# Patient Record
Sex: Male | Born: 1990 | Race: White | Hispanic: No | Marital: Single | Smoking: Current every day smoker
Health system: Southern US, Community
[De-identification: ages and names within clinical notes are randomized; demographics above are authoritative.]

## PROBLEM LIST (undated history)

## (undated) DIAGNOSIS — G4730 Sleep apnea, unspecified: Secondary | ICD-10-CM

## (undated) DIAGNOSIS — G43909 Migraine, unspecified, not intractable, without status migrainosus: Secondary | ICD-10-CM

## (undated) DIAGNOSIS — J189 Pneumonia, unspecified organism: Secondary | ICD-10-CM

## (undated) DIAGNOSIS — L0591 Pilonidal cyst without abscess: Secondary | ICD-10-CM

## (undated) DIAGNOSIS — Z62810 Personal history of physical and sexual abuse in childhood: Secondary | ICD-10-CM

## (undated) DIAGNOSIS — A4902 Methicillin resistant Staphylococcus aureus infection, unspecified site: Secondary | ICD-10-CM

## (undated) DIAGNOSIS — B019 Varicella without complication: Secondary | ICD-10-CM

## (undated) DIAGNOSIS — F1910 Other psychoactive substance abuse, uncomplicated: Secondary | ICD-10-CM

## (undated) DIAGNOSIS — R519 Headache, unspecified: Secondary | ICD-10-CM

## (undated) DIAGNOSIS — F319 Bipolar disorder, unspecified: Secondary | ICD-10-CM

## (undated) HISTORY — DX: Bipolar disorder, unspecified: F31.9

## (undated) HISTORY — DX: Personal history of physical and sexual abuse in childhood: Z62.810

## (undated) HISTORY — DX: Varicella without complication: B01.9

## (undated) HISTORY — DX: Pilonidal cyst without abscess: L05.91

## (undated) HISTORY — DX: Migraine, unspecified, not intractable, without status migrainosus: G43.909

## (undated) HISTORY — PX: APPENDECTOMY: SHX54

## (undated) HISTORY — DX: Other psychoactive substance abuse, uncomplicated: F19.10

## (undated) HISTORY — DX: Headache, unspecified: R51.9

---
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
## 2008-02-18 ENCOUNTER — Emergency Department: Payer: Self-pay | Admitting: Emergency Medicine

## 2008-07-30 HISTORY — DX: Gilbert syndrome: E80.4

## 2008-09-16 ENCOUNTER — Emergency Department: Payer: Self-pay | Admitting: Emergency Medicine

## 2008-09-19 ENCOUNTER — Emergency Department: Payer: Self-pay | Admitting: Emergency Medicine

## 2009-10-29 ENCOUNTER — Emergency Department: Payer: Self-pay | Admitting: Emergency Medicine

## 2010-06-14 ENCOUNTER — Emergency Department: Payer: Self-pay | Admitting: Emergency Medicine

## 2010-10-12 ENCOUNTER — Emergency Department: Payer: Self-pay

## 2011-02-26 ENCOUNTER — Emergency Department: Payer: Self-pay | Admitting: Emergency Medicine

## 2012-02-18 ENCOUNTER — Emergency Department: Payer: Self-pay | Admitting: Emergency Medicine

## 2012-06-09 ENCOUNTER — Emergency Department: Payer: Self-pay | Admitting: Emergency Medicine

## 2012-06-11 ENCOUNTER — Emergency Department: Payer: Self-pay | Admitting: Emergency Medicine

## 2012-06-16 ENCOUNTER — Emergency Department: Payer: Self-pay | Admitting: Emergency Medicine

## 2012-12-31 ENCOUNTER — Emergency Department: Payer: Self-pay | Admitting: Internal Medicine

## 2013-04-19 ENCOUNTER — Emergency Department: Payer: Self-pay | Admitting: Emergency Medicine

## 2013-08-25 ENCOUNTER — Emergency Department: Payer: Self-pay | Admitting: Emergency Medicine

## 2014-07-30 DIAGNOSIS — F1120 Opioid dependence, uncomplicated: Secondary | ICD-10-CM

## 2014-07-30 HISTORY — DX: Opioid dependence, uncomplicated: F11.20

## 2015-02-09 ENCOUNTER — Emergency Department
Admission: EM | Admit: 2015-02-09 | Discharge: 2015-02-09 | Disposition: A | Discharge status: Home / Self Care | Payer: Self-pay | Attending: Emergency Medicine | Admitting: Emergency Medicine

## 2015-02-09 ENCOUNTER — Encounter: Payer: Self-pay | Admitting: Emergency Medicine

## 2015-02-09 ENCOUNTER — Emergency Department: Payer: Self-pay

## 2015-02-09 DIAGNOSIS — S29012A Strain of muscle and tendon of back wall of thorax, initial encounter: Secondary | ICD-10-CM | POA: Insufficient documentation

## 2015-02-09 DIAGNOSIS — Y9289 Other specified places as the place of occurrence of the external cause: Secondary | ICD-10-CM | POA: Insufficient documentation

## 2015-02-09 DIAGNOSIS — Y9389 Activity, other specified: Secondary | ICD-10-CM | POA: Insufficient documentation

## 2015-02-09 DIAGNOSIS — Z720 Tobacco use: Secondary | ICD-10-CM | POA: Insufficient documentation

## 2015-02-09 DIAGNOSIS — S29019A Strain of muscle and tendon of unspecified wall of thorax, initial encounter: Secondary | ICD-10-CM

## 2015-02-09 DIAGNOSIS — Z880 Allergy status to penicillin: Secondary | ICD-10-CM | POA: Insufficient documentation

## 2015-02-09 DIAGNOSIS — S161XXA Strain of muscle, fascia and tendon at neck level, initial encounter: Secondary | ICD-10-CM | POA: Insufficient documentation

## 2015-02-09 DIAGNOSIS — Y998 Other external cause status: Secondary | ICD-10-CM | POA: Insufficient documentation

## 2015-02-09 MED ORDER — IBUPROFEN 800 MG PO TABS: 800.0000 mg | ORAL_TABLET | Freq: Three times a day (TID) | ORAL | Status: DC | PRN

## 2015-02-09 MED ORDER — CYCLOBENZAPRINE HCL 10 MG PO TABS: 10.0000 mg | ORAL_TABLET | Freq: Three times a day (TID) | ORAL | Status: DC | PRN
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
## 2016-05-22 ENCOUNTER — Emergency Department: Payer: No Typology Code available for payment source

## 2016-05-22 ENCOUNTER — Encounter: Payer: Self-pay | Admitting: Emergency Medicine

## 2016-05-22 ENCOUNTER — Emergency Department
Admission: EM | Admit: 2016-05-22 | Discharge: 2016-05-22 | Disposition: A | Discharge status: Home / Self Care | Payer: No Typology Code available for payment source | Attending: Emergency Medicine | Admitting: Emergency Medicine

## 2016-05-22 DIAGNOSIS — S3992XA Unspecified injury of lower back, initial encounter: Secondary | ICD-10-CM | POA: Diagnosis present

## 2016-05-22 DIAGNOSIS — Y9389 Activity, other specified: Secondary | ICD-10-CM | POA: Diagnosis not present

## 2016-05-22 DIAGNOSIS — Y999 Unspecified external cause status: Secondary | ICD-10-CM | POA: Insufficient documentation

## 2016-05-22 DIAGNOSIS — S39012A Strain of muscle, fascia and tendon of lower back, initial encounter: Secondary | ICD-10-CM | POA: Diagnosis not present

## 2016-05-22 DIAGNOSIS — S161XXA Strain of muscle, fascia and tendon at neck level, initial encounter: Secondary | ICD-10-CM | POA: Diagnosis not present

## 2016-05-22 DIAGNOSIS — Y92410 Unspecified street and highway as the place of occurrence of the external cause: Secondary | ICD-10-CM | POA: Insufficient documentation

## 2016-05-22 DIAGNOSIS — Z791 Long term (current) use of non-steroidal anti-inflammatories (NSAID): Secondary | ICD-10-CM | POA: Insufficient documentation

## 2016-05-22 DIAGNOSIS — F17200 Nicotine dependence, unspecified, uncomplicated: Secondary | ICD-10-CM | POA: Insufficient documentation

## 2016-05-22 MED ORDER — IBUPROFEN 600 MG PO TABS
600.0000 mg | ORAL_TABLET | Freq: Once | ORAL | Status: AC
  Administered 2016-05-22: 600 mg via ORAL
  Filled 2016-05-22: qty 1

## 2016-05-22 MED ORDER — OXYCODONE-ACETAMINOPHEN 5-325 MG PO TABS
1.0000 | ORAL_TABLET | Freq: Once | ORAL | Status: AC
  Administered 2016-05-22: 1 via ORAL
  Filled 2016-05-22: qty 1

## 2016-05-22 MED ORDER — OXYCODONE-ACETAMINOPHEN 7.5-325 MG PO TABS
1.0000 | ORAL_TABLET | Freq: Four times a day (QID) | ORAL | 0 refills | Status: DC | PRN
Start: 2016-05-22 — End: 2019-12-14

## 2016-05-22 MED ORDER — IBUPROFEN 600 MG PO TABS: 600.0000 mg | ORAL_TABLET | Freq: Three times a day (TID) | ORAL | 0 refills | Status: DC | PRN

## 2016-05-22 MED ORDER — METHOCARBAMOL 750 MG PO TABS: 750.0000 mg | ORAL_TABLET | Freq: Four times a day (QID) | ORAL | 0 refills | Status: DC

## 2016-05-22 MED ORDER — CYCLOBENZAPRINE HCL 10 MG PO TABS
10.0000 mg | ORAL_TABLET | Freq: Once | ORAL | Status: AC
  Administered 2016-05-22: 10 mg via ORAL
  Filled 2016-05-22: qty 1
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
## 2016-05-31 ENCOUNTER — Ambulatory Visit: Payer: Self-pay | Admitting: Family Medicine
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
## 2018-04-21 DIAGNOSIS — J180 Bronchopneumonia, unspecified organism: Secondary | ICD-10-CM | POA: Diagnosis not present
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
## 2019-07-06 ENCOUNTER — Other Ambulatory Visit: Payer: Self-pay

## 2019-07-06 DIAGNOSIS — Z20822 Contact with and (suspected) exposure to covid-19: Secondary | ICD-10-CM

## 2019-07-07 LAB — NOVEL CORONAVIRUS, NAA: SARS-CoV-2, NAA: NOT DETECTED

## 2019-09-29 DIAGNOSIS — Z0389 Encounter for observation for other suspected diseases and conditions ruled out: Secondary | ICD-10-CM | POA: Diagnosis not present

## 2019-10-27 DIAGNOSIS — Z79899 Other long term (current) drug therapy: Secondary | ICD-10-CM | POA: Diagnosis not present

## 2019-10-27 DIAGNOSIS — Z0389 Encounter for observation for other suspected diseases and conditions ruled out: Secondary | ICD-10-CM | POA: Diagnosis not present

## 2019-11-23 DIAGNOSIS — F3181 Bipolar II disorder: Secondary | ICD-10-CM | POA: Diagnosis not present

## 2019-11-23 DIAGNOSIS — F1120 Opioid dependence, uncomplicated: Secondary | ICD-10-CM | POA: Diagnosis not present

## 2019-11-24 DIAGNOSIS — F1920 Other psychoactive substance dependence, uncomplicated: Secondary | ICD-10-CM | POA: Diagnosis not present

## 2019-11-24 DIAGNOSIS — Z79899 Other long term (current) drug therapy: Secondary | ICD-10-CM | POA: Diagnosis not present

## 2019-11-24 DIAGNOSIS — Z0389 Encounter for observation for other suspected diseases and conditions ruled out: Secondary | ICD-10-CM | POA: Diagnosis not present

## 2019-12-10 ENCOUNTER — Other Ambulatory Visit: Payer: Self-pay

## 2019-12-14 ENCOUNTER — Other Ambulatory Visit: Payer: Self-pay

## 2019-12-14 ENCOUNTER — Encounter: Payer: Self-pay | Admitting: Nurse Practitioner

## 2019-12-14 ENCOUNTER — Ambulatory Visit: Payer: BC Managed Care – PPO | Admitting: Nurse Practitioner

## 2019-12-14 VITALS — BP 120/82 | HR 108 | Temp 98.1°F | Ht 71.0 in | Wt 282.0 lb

## 2019-12-14 DIAGNOSIS — Z6839 Body mass index (BMI) 39.0-39.9, adult: Secondary | ICD-10-CM | POA: Diagnosis not present

## 2019-12-14 DIAGNOSIS — F319 Bipolar disorder, unspecified: Secondary | ICD-10-CM

## 2019-12-14 DIAGNOSIS — G4730 Sleep apnea, unspecified: Secondary | ICD-10-CM | POA: Diagnosis not present

## 2019-12-14 DIAGNOSIS — G43009 Migraine without aura, not intractable, without status migrainosus: Secondary | ICD-10-CM | POA: Diagnosis not present

## 2019-12-14 DIAGNOSIS — L988 Other specified disorders of the skin and subcutaneous tissue: Secondary | ICD-10-CM

## 2019-12-14 DIAGNOSIS — R030 Elevated blood-pressure reading, without diagnosis of hypertension: Secondary | ICD-10-CM

## 2019-12-14 DIAGNOSIS — F4321 Adjustment disorder with depressed mood: Secondary | ICD-10-CM

## 2019-12-14 DIAGNOSIS — E669 Obesity, unspecified: Secondary | ICD-10-CM | POA: Diagnosis not present

## 2019-12-14 DIAGNOSIS — Z0000 Encounter for general adult medical examination without abnormal findings: Secondary | ICD-10-CM
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
## 2019-12-14 NOTE — Patient Instructions (Addendum)
It was nice to meet you today.  I have sent you to the laboratory today to have routine preventative healthcare blood work including your thyroid checked.  We will look for risk for diabetes, high cholesterol, check your kidney function, blood counts.  We will check a urinalysis.  I have placed an URGENT  referral in for sleep apnea study.  Your fiance states that she stopped breathing, you snore, you have excessive fatigue, and you wake up at night feeling short of breath.  You should hear from the ER pulmonologist in the next week.  I have placed an urgent referral.  I also placed a routine referral for your history of migraine headaches.    It is important to have your sleep corrected is not one of the palpitations and needs to be in place to prevent migraines.  Also healthy diet, exercise, stress management are all very important.  I am sorry to hear about the recent loss of your father.  You have been experiencing grief, stress, some anxiety and depression.  I would recommend behavioral therapist help with grief.  You may benefit from contacting the hospice home where they are going to therapy.  [facility] [place] grief counseling [phone].   Please see me in one month to see how you are doing.

## 2019-12-15 ENCOUNTER — Encounter: Payer: Self-pay | Admitting: Nurse Practitioner

## 2019-12-15 DIAGNOSIS — F319 Bipolar disorder, unspecified: Secondary | ICD-10-CM | POA: Insufficient documentation

## 2019-12-15 DIAGNOSIS — Z0000 Encounter for general adult medical examination without abnormal findings: Secondary | ICD-10-CM | POA: Insufficient documentation

## 2019-12-15 DIAGNOSIS — L988 Other specified disorders of the skin and subcutaneous tissue: Secondary | ICD-10-CM

## 2019-12-15 DIAGNOSIS — G43009 Migraine without aura, not intractable, without status migrainosus: Secondary | ICD-10-CM | POA: Insufficient documentation

## 2019-12-15 DIAGNOSIS — F4321 Adjustment disorder with depressed mood: Secondary | ICD-10-CM | POA: Insufficient documentation

## 2019-12-15 DIAGNOSIS — R030 Elevated blood-pressure reading, without diagnosis of hypertension: Secondary | ICD-10-CM | POA: Insufficient documentation

## 2019-12-15 DIAGNOSIS — G4730 Sleep apnea, unspecified: Secondary | ICD-10-CM | POA: Insufficient documentation

## 2019-12-15 DIAGNOSIS — Z6839 Body mass index (BMI) 39.0-39.9, adult: Secondary | ICD-10-CM | POA: Insufficient documentation

## 2019-12-15 HISTORY — DX: Other specified disorders of the skin and subcutaneous tissue: L98.8

## 2019-12-15 LAB — COMPREHENSIVE METABOLIC PANEL
ALT: 23 U/L (ref 0–53)
AST: 20 U/L (ref 0–37)
Albumin: 4.5 g/dL (ref 3.5–5.2)
Alkaline Phosphatase: 45 U/L (ref 39–117)
BUN: 14 mg/dL (ref 6–23)
CO2: 30 mEq/L (ref 19–32)
Calcium: 9.2 mg/dL (ref 8.4–10.5)
Chloride: 103 mEq/L (ref 96–112)
Creatinine, Ser: 1.04 mg/dL (ref 0.40–1.50)
GFR: 83.4 mL/min (ref >60.00)
Glucose, Bld: 87 mg/dL (ref 70–99)
Potassium: 4.4 mEq/L (ref 3.5–5.1)
Sodium: 137 mEq/L (ref 135–145)
Total Bilirubin: 0.7 mg/dL (ref 0.2–1.2)
Total Protein: 6.8 g/dL (ref 6.0–8.3)

## 2019-12-15 LAB — CBC WITH DIFFERENTIAL/PLATELET
Basophils Absolute: 0 10*3/uL (ref 0.0–0.1)
Basophils Relative: 0.5 % (ref 0.0–3.0)
Eosinophils Absolute: 0.2 10*3/uL (ref 0.0–0.7)
Eosinophils Relative: 3.4 % (ref 0.0–5.0)
HCT: 43.1 % (ref 39.0–52.0)
Hemoglobin: 14.8 g/dL (ref 13.0–17.0)
Lymphocytes Relative: 25.4 % (ref 12.0–46.0)
Lymphs Abs: 1.7 10*3/uL (ref 0.7–4.0)
MCHC: 34.5 g/dL (ref 30.0–36.0)
MCV: 94.3 fl (ref 78.0–100.0)
Monocytes Absolute: 0.3 10*3/uL (ref 0.1–1.0)
Monocytes Relative: 4.8 % (ref 3.0–12.0)
Neutro Abs: 4.5 10*3/uL (ref 1.4–7.7)
Neutrophils Relative %: 65.9 % (ref 43.0–77.0)
Platelets: 333 10*3/uL (ref 150.0–400.0)
RBC: 4.56 Mil/uL (ref 4.22–5.81)
RDW: 12.6 % (ref 11.5–15.5)
WBC: 6.9 10*3/uL (ref 4.0–10.5)

## 2019-12-15 LAB — URINALYSIS, ROUTINE W REFLEX MICROSCOPIC
Bilirubin Urine: NEGATIVE
Hgb urine dipstick: NEGATIVE
Ketones, ur: NEGATIVE
Leukocytes,Ua: NEGATIVE
Nitrite: NEGATIVE
RBC / HPF: NONE SEEN (ref 0–?)
Specific Gravity, Urine: 1.02 (ref 1.000–1.030)
Total Protein, Urine: NEGATIVE
Urine Glucose: NEGATIVE
Urobilinogen, UA: 0.2 (ref 0.0–1.0)
pH: 7 (ref 5.0–8.0)

## 2019-12-15 LAB — LIPID PANEL
Cholesterol: 184 mg/dL (ref 0–200)
HDL: 44.8 mg/dL (ref >39.00)
LDL Cholesterol: 116 mg/dL — ABNORMAL HIGH (ref 0–99)
NonHDL: 138.76
Total CHOL/HDL Ratio: 4
Triglycerides: 115 mg/dL (ref 0.0–149.0)
VLDL: 23 mg/dL (ref 0.0–40.0)

## 2019-12-15 LAB — HEMOGLOBIN A1C: Hgb A1c MFr Bld: 5.2 % (ref 4.6–6.5)

## 2019-12-15 NOTE — Assessment & Plan Note (Signed)
Referral to PULM for sleep apnea testing.

## 2019-12-15 NOTE — Assessment & Plan Note (Signed)
Referral to Neurology. Taking NSAIDs as needed.

## 2019-12-15 NOTE — Assessment & Plan Note (Signed)
To monitor BP
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
## 2019-12-15 NOTE — Assessment & Plan Note (Signed)
He reports having a hard time after his father passed. He has now been experiencing grief after the loss of his father in January. He thinks he would benefit from additional behavioral therapy counseling as this service is not offered at his [place] office. GAD-&: 16, PHQ-9: 18 No SI or HI: " I would never do that." PLAN: Given grief counseling at Hospice. Talk with [facility] psychiatrist.

## 2019-12-15 NOTE — Assessment & Plan Note (Signed)
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 Previous surgical consult. He  is not interested in surgical referral at this time. He does not have work time off for healing and does not want any pain meds as he is recovering from opioid addiction on suboxone.

## 2019-12-15 NOTE — Assessment & Plan Note (Signed)
Healthy diet/lifestyle

## 2019-12-16 ENCOUNTER — Other Ambulatory Visit: Payer: BC Managed Care – PPO

## 2019-12-16 ENCOUNTER — Telehealth: Payer: Self-pay | Admitting: Nurse Practitioner

## 2019-12-16 DIAGNOSIS — F319 Bipolar disorder, unspecified: Secondary | ICD-10-CM

## 2019-12-16 LAB — VITAMIN D 25 HYDROXY (VIT D DEFICIENCY, FRACTURES): VITD: 23 ng/mL — ABNORMAL LOW (ref 30.00–100.00)

## 2019-12-16 LAB — TSH: TSH: 0.87 u[IU]/mL (ref 0.35–4.50)

## 2019-12-16 NOTE — Telephone Encounter (Signed)
Faxed add on sheet to lab

## 2019-12-16 NOTE — Telephone Encounter (Signed)
Please add-on Vit D level.

## 2019-12-22 DIAGNOSIS — Z0389 Encounter for observation for other suspected diseases and conditions ruled out: Secondary | ICD-10-CM | POA: Diagnosis not present

## 2019-12-22 DIAGNOSIS — Z5181 Encounter for therapeutic drug level monitoring: Secondary | ICD-10-CM | POA: Diagnosis not present

## 2020-01-12 ENCOUNTER — Other Ambulatory Visit: Payer: Self-pay

## 2020-01-14 ENCOUNTER — Other Ambulatory Visit: Payer: Self-pay

## 2020-01-14 ENCOUNTER — Ambulatory Visit: Payer: BC Managed Care – PPO | Admitting: Nurse Practitioner

## 2020-01-14 ENCOUNTER — Encounter: Payer: Self-pay | Admitting: Nurse Practitioner

## 2020-01-14 VITALS — BP 128/82 | HR 96 | Temp 96.9°F | Ht 71.0 in | Wt 281.0 lb

## 2020-01-14 DIAGNOSIS — F4321 Adjustment disorder with depressed mood: Secondary | ICD-10-CM | POA: Diagnosis not present

## 2020-01-14 DIAGNOSIS — I10 Essential (primary) hypertension: Secondary | ICD-10-CM | POA: Insufficient documentation

## 2020-01-14 DIAGNOSIS — R000 Tachycardia, unspecified: Secondary | ICD-10-CM | POA: Insufficient documentation

## 2020-01-14 DIAGNOSIS — F1121 Opioid dependence, in remission: Secondary | ICD-10-CM

## 2020-01-14 DIAGNOSIS — R030 Elevated blood-pressure reading, without diagnosis of hypertension: Secondary | ICD-10-CM | POA: Diagnosis not present

## 2020-01-14 DIAGNOSIS — G43009 Migraine without aura, not intractable, without status migrainosus: Secondary | ICD-10-CM

## 2020-01-14 NOTE — Patient Instructions (Addendum)
You are making some wonderful lifestyle changes.  I congratulate you for working on smoking.  Purchase a blood pressure cuff OMRON is a good brand- check your blood pressure and bring  in the readings  in 4 months.  We can see how you are getting along.  If your blood pressure is elevated we can start treatment. But,  hopefully your lifestyle changes will make a huge difference.  Continue with a low-fat, low transfat, heart healthy diet.  Continue with regular exercise.  Continue with your smoking cessation plans.  Once you get evaluated for sleep apnea and if needed treated, you might see good improvement in your blood pressure as well.  Continue with the vitamin D supplement and we can check that with your next labs.
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
## 2020-01-16 DIAGNOSIS — F1121 Opioid dependence, in remission: Secondary | ICD-10-CM | POA: Insufficient documentation

## 2020-01-16 NOTE — Assessment & Plan Note (Addendum)
Remains on Zubsolv 5.7-1.4 mg and taking 2 tablets under the tongue daily. Followed by Dr. Caleen Jobs with [facility] in [place]. She prescribes Suboxone (zubsolv ) 5.7-1.4 mg SUBL place 2 tablets under tongue daily for opioid addiction and does monthly video visits and urine drug testing monthly. He attends local AA meetings because he finds the likes that group better and  follows their 12-step program.  He never had alcohol addiction, just the opioids.

## 2020-01-16 NOTE — Assessment & Plan Note (Signed)
No problem with migraine HA recently.
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
## 2020-01-16 NOTE — Assessment & Plan Note (Signed)
Reports he has been feeling much better.  He has been addressing his grief with a Veterinary surgeon.  He had a vacation.

## 2020-01-18 DIAGNOSIS — F1120 Opioid dependence, uncomplicated: Secondary | ICD-10-CM | POA: Diagnosis not present

## 2020-01-18 DIAGNOSIS — F3181 Bipolar II disorder: Secondary | ICD-10-CM | POA: Diagnosis not present

## 2020-01-19 ENCOUNTER — Other Ambulatory Visit: Payer: Self-pay

## 2020-01-19 ENCOUNTER — Ambulatory Visit: Payer: BC Managed Care – PPO | Admitting: Pulmonary Disease

## 2020-01-19 ENCOUNTER — Encounter: Payer: Self-pay | Admitting: Pulmonary Disease

## 2020-01-19 VITALS — BP 140/90 | HR 104 | Ht 71.0 in | Wt 283.0 lb

## 2020-01-19 DIAGNOSIS — Z9189 Other specified personal risk factors, not elsewhere classified: Secondary | ICD-10-CM | POA: Insufficient documentation

## 2020-01-19 DIAGNOSIS — F17200 Nicotine dependence, unspecified, uncomplicated: Secondary | ICD-10-CM

## 2020-01-19 MED ORDER — NICOTINE POLACRILEX 4 MG MT LOZG: 4.0000 mg | LOZENGE | OROMUCOSAL | 3 refills | Status: DC | PRN

## 2020-01-19 NOTE — Assessment & Plan Note (Signed)
Epworth score today 12 High likelihood of obstructive sleep apnea given patient's reporting of poor sleep  Plan: Home sleep study ordered today

## 2020-01-19 NOTE — Assessment & Plan Note (Signed)
Plan: Emphasized need to stop smoking Continue to work with smoking cessation team from employer Start 21 mg nicotine patch as managed by smoking cessation team Prescription for 4 mg nicotine lozenge today Please contact our office if you are having difficulty with stopping smoking Recommended Pneumovax 23, you declined this today
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
## 2020-01-19 NOTE — Patient Instructions (Addendum)
You were seen today by [name], NP  for:   1. At risk for obstructive sleep apnea  - Home sleep test; Future  2. Smoker  - nicotine polacrilex (COMMIT) 4 MG lozenge; Take 1 lozenge (4 mg total) by mouth as needed for smoking cessation.  Dispense: 108 tablet; Refill: 3  We recommend that you stop smoking.  >>>You need to set a quit date >>>If you have friends or family who smoke, let them know you are trying to quit and not to smoke around you or in your living environment  Smoking Cessation Resources:  1 800 QUIT NOW  >>> Patient to call this resource and utilize it to help support her quit smoking >>> Keep up your hard work with stopping smoking  You can also contact the [facility] >>>For smoking cessation classes call [phone]  We do not recommend using e-cigarettes as a form of stopping smoking  You can sign up for smoking cessation support texts and information:  >>>https://smokefree.gov/smokefreetxt   Nicotine patches: >>>Make sure you rotate sites that you do not get skin irritation, Apply 1 patch each morning to a non-hairy skin site  If you are smoking greater than 10 cigarettes/day and weigh over 45 kg start with the nicotine patch of 21 mg a day for 6 weeks, then 14 mg a day for 2 weeks, then finished with 7 mg a day for 2 weeks, then stop  If you are smoking less than 10 cigarettes a day or weight less than 45 kg start with medium dose pack of 14 mg a day for 6 weeks, followed by 7 mg a day for 2 weeks   >>>If insomnia occurs you are having trouble sleeping you can take the patch off at night, and place a new one on in the morning >>>If the patch is removed at night and you have morning cravings start short acting nicotine replacement therapy such as gum or lozenges   Nicotine lozenge: Lozenges are commonly uses short acting NRT product  >>>Smokers who smoke within 30 minutes of awakening should use 4 mg dose >>>Smokers who wait more than  30 minutes after awakening to smoke should use 2 mg dose  Can use up to 1 lozenge every 1-2 hours for 6 weeks >>>Total amount of lozenges that can be used per day as 20 >>>Gradually reduce number of lozenges used per day after 2 weeks of use  Place lozenge in mouth and allowed to dissolve for 30 minutes loss and does not need to be chewed  Lozenges have advantages to be able to be used in people with TMG, poor dentition, dentures   We recommended the pneumonia vaccine Pneumovax 23, you declined   We recommend today:  Orders Placed This Encounter  Procedures  . Home sleep test    Standing Status:   Future    Standing Expiration Date:   01/18/2021    Order Specific Question:   Where should this test be performed:    Answer:   [facility] - Pul[facility]   Orders Placed This Encounter  Procedures  . Home sleep test   Meds ordered this encounter  Medications  . nicotine polacrilex (COMMIT) 4 MG lozenge    Sig: Take 1 lozenge (4 mg total) by mouth as needed for smoking cessation.    Dispense:  108 tablet    Refill:  3    Follow Up:    Return in about 3 months (around 04/20/2020), or if symptoms  worsen or fail to improve, for [name].   Please do your part to reduce the spread of COVID-19:      Reduce your risk of any infection  and COVID19 by using the similar precautions used for avoiding the common cold or flu:  [name] Wash your hands often with soap and warm water for at least 20 seconds.  If soap and water are not readily available, use an alcohol-based hand sanitizer with at least 60% alcohol.  . If coughing or sneezing, cover your mouth and nose by coughing or sneezing into the elbow areas of your shirt or coat, into a tissue or into your sleeve (not your hands). [name] A MASK when in public  . Avoid shaking hands with others and consider head nods or verbal greetings only. . Avoid touching your eyes, nose, or mouth with unwashed hands.  . Avoid close contact  with people who are sick. . Avoid places or events with large numbers of people in one location, like concerts or sporting events. . If you have some symptoms but not all symptoms, continue to monitor at home and seek medical attention if your symptoms worsen. . If you are having a medical emergency, call 911.   ADDITIONAL HEALTHCARE OPTIONS FOR PATIENTS  [facility] / e-Visit: [email or web address]         [facility]: [phone]  [facility]: [phone]                   [facility]: [phone]     It is flu season:   >>> Best ways to protect herself from the flu: Receive the yearly flu vaccine, practice good hand hygiene washing with soap and also using hand sanitizer when available, eat a nutritious meals, get adequate rest, hydrate appropriately   Please contact the office if your symptoms worsen or you have concerns that you are not improving.   Thank you for choosing [facility] for your healthcare, and for allowing [place] to partner with you on your healthcare journey. I am thankful to be able to provide care to you today.   [name] FNP-C   Sleep Apnea Sleep apnea affects breathing during sleep. It causes breathing to stop for a short time or to become shallow. It can also increase the risk of:  Heart attack.  Stroke.  Being very overweight (obese).  Diabetes.  Heart failure.  Irregular heartbeat. The goal of treatment is to help you breathe normally again. What are the causes? There are three kinds of sleep apnea:  Obstructive sleep apnea. This is caused by a blocked or collapsed airway.  Central sleep apnea. This happens when the brain does not send the right signals to the muscles that control breathing.  Mixed sleep apnea. This is a combination of obstructive and central sleep apnea. The most common cause of this condition is a collapsed or blocked  airway. This can happen if:  Your throat muscles are too relaxed.  Your tongue and tonsils are too large.  You are overweight.  Your airway is too small. What increases the risk?  Being overweight.  Smoking.  Having a small airway.  Being older.  Being male.  Drinking alcohol.  Taking medicines to calm yourself (sedatives or tranquilizers).  Having family members with the condition. What are the signs or symptoms?  Trouble staying asleep.  Being sleepy or tired during the day.  Getting angry a lot.  Loud snoring.  Headaches in the morning.  Not being able to focus your mind (concentrate).  Forgetting things.  Less interest in sex.  Mood swings.  Personality changes.  Feelings of sadness (depression).  Waking up a lot during the night to pee (urinate).  Dry mouth.  Sore throat. How is this diagnosed?  Your medical history.  A physical exam.  A test that is done when you are sleeping (sleep study). The test is most often done in a sleep lab but may also be done at home. How is this treated?   Sleeping on your side.  Using a medicine to get rid of mucus in your nose (decongestant).  Avoiding the use of alcohol, medicines to help you relax, or certain pain medicines (narcotics).  Losing weight, if needed.  Changing your diet.  Not smoking.  Using a machine to open your airway while you sleep, such as: ? An oral appliance. This is a mouthpiece that shifts your lower jaw forward. ? A CPAP device. This device blows air through a mask when you breathe out (exhale). ? An EPAP device. This has valves that you put in each nostril. ? A BPAP device. This device blows air through a mask when you breathe in (inhale) and breathe out.  Having surgery if other treatments do not work. It is important to get treatment for sleep apnea. Without treatment, it can lead to:  High blood pressure.  Coronary artery disease.  In men, not being able to have  an erection (impotence).  Reduced thinking ability. Follow these instructions at home: Lifestyle  Make changes that your doctor recommends.  Eat a healthy diet.  Lose weight if needed.  Avoid alcohol, medicines to help you relax, and some pain medicines.  Do not use any products that contain nicotine or tobacco, such as cigarettes, e-cigarettes, and chewing tobacco. If you need help quitting, ask your doctor. General instructions  Take over-the-counter and prescription medicines only as told by your doctor.  If you were given a machine to use while you sleep, use it only as told by your doctor.  If you are having surgery, make sure to tell your doctor you have sleep apnea. You may need to bring your device with you.  Keep all follow-up visits as told by your doctor. This is important. Contact a doctor if:  The machine that you were given to use during sleep bothers you or does not seem to be working.  You do not get better.  You get worse. Get help right away if:  Your chest hurts.  You have trouble breathing in enough air.  You have an uncomfortable feeling in your back, arms, or stomach.  You have trouble talking.  One side of your body feels weak.  A part of your face is hanging down. These symptoms may be an emergency. Do not wait to see if the symptoms will go away. Get medical help right away. Call your local emergency services (911 in the [place] Do not drive yourself to the hospital. Summary  This condition affects breathing during sleep.  The most common cause is a collapsed or blocked airway.  The goal of treatment is to help you breathe normally while you sleep. This information is not intended to replace advice given to you by your health care provider. Make sure you discuss any questions you have with your health care provider. Document Revised: 05/02/2018 Document Reviewed: 03/11/2018 Elsevier Patient Education  2020 [employer].  Health  Risks of Smoking Smoking cigarettes is very bad for your health. Tobacco smoke has over 200 known poisons in it. It contains the poisonous gases nitrogen oxide and carbon monoxide. There are over 60 chemicals in tobacco smoke that cause cancer. Smoking is difficult to quit because a chemical in tobacco, called nicotine, causes addiction or dependence. When you smoke and inhale, nicotine is absorbed rapidly into the bloodstream through your lungs. Both inhaled and non-inhaled nicotine may be addictive. What are the risks of cigarette smoke? Cigarette smokers have an increased risk of many serious medical problems, including:  Lung cancer.  Lung disease, such as pneumonia, bronchitis, and emphysema.  Chest pain (angina) and heart attack because the heart is not getting enough oxygen.  Heart disease and peripheral blood vessel disease.  High blood pressure (hypertension).  Stroke.  Oral cancer, including cancer of the lip, mouth, or voice box.  Bladder cancer.  Pancreatic cancer.  Cervical cancer.  Pregnancy complications, including premature birth.  Stillbirths and smaller newborn babies, birth defects, and genetic damage to sperm.  Early menopause.  Lower estrogen level for women.  Infertility.  Facial wrinkles.  Blindness.  Increased risk of broken bones (fractures).  Senile dementia.  Stomach ulcers and internal bleeding.  Delayed wound healing and increased risk of complications during surgery.  Even smoking lightly shortens your life expectancy by several years. Because of secondhand smoke exposure, children of smokers have an increased risk of the following:  Sudden infant death syndrome (SIDS).  Respiratory infections.  Lung cancer.  Heart disease.  Ear infections. What are the benefits of quitting? There are many health benefits of quitting smoking. Here are some of them:  Within days of quitting smoking, your risk of having a heart attack  decreases, your blood flow improves, and your lung capacity improves. Blood pressure, pulse rate, and breathing patterns start returning to normal soon after quitting.  Within months, your lungs may clear up completely.  Quitting for 10 years reduces your risk of developing lung cancer and heart disease to almost that of a nonsmoker.  People who quit may see an improvement in their overall quality of life. How do I quit smoking?     Smoking is an addiction with both physical and psychological effects, and longtime habits can be hard to change. Your health care provider can recommend:  Programs and community resources, which may include group support, education, or talk therapy.  Prescription medicines to help reduce cravings.  Nicotine replacement products, such as patches, gum, and nasal sprays. Use these products only as directed. Do not replace cigarette smoking with electronic cigarettes, which are commonly called e-cigarettes. The safety of e-cigarettes is not known, and some may contain harmful chemicals.  A combination of two or more of these methods. Where to find more information  American Lung Association: www.lung.org  American Cancer Society: www.cancer.org Summary  Smoking cigarettes is very bad for your health. Cigarette smokers have an increased risk of many serious medical problems, including several cancers, heart disease, and stroke.  Smoking is an addiction with both physical and psychological effects, and longtime habits can be hard to change.  By stopping right away, you can greatly reduce the risk of medical problems for you and your family.  To help you quit smoking, your health care provider can recommend programs, community resources, prescription medicines, and nicotine replacement products such as patches, gum, and nasal sprays. This information is not intended to replace advice given to you by your  health care provider. Make sure you discuss any questions  you have with your health care provider. Document Revised: 10/17/2017 Document Reviewed: 07/20/2016 Elsevier Patient Education  2020 [employer]

## 2020-01-20 DIAGNOSIS — Z0389 Encounter for observation for other suspected diseases and conditions ruled out: Secondary | ICD-10-CM | POA: Diagnosis not present

## 2020-01-20 DIAGNOSIS — Z5181 Encounter for therapeutic drug level monitoring: Secondary | ICD-10-CM | POA: Diagnosis not present

## 2020-01-25 NOTE — Progress Notes (Signed)
Reviewed and agree with assessment/plan.   Burlin Mcnair, MD [facility] 01/25/2020, 7:07 AM Pager:  [phone]  

## 2020-02-08 ENCOUNTER — Telehealth: Payer: Self-pay | Admitting: Nurse Practitioner

## 2020-02-08 ENCOUNTER — Ambulatory Visit
Admission: EM | Admit: 2020-02-08 | Discharge: 2020-02-08 | Disposition: A | Discharge status: Home / Self Care | Payer: BC Managed Care – PPO

## 2020-02-08 DIAGNOSIS — R109 Unspecified abdominal pain: Secondary | ICD-10-CM

## 2020-02-08 NOTE — ED Triage Notes (Signed)
Patient reports a sharp stabbing pain to the right of the umbilicus x2 days. Reports that it only happens when he bends over to pick something up. States that it is so intense, it makes him sweat.   Denies: bloating, diarrhea, vomiting  OTC: ibuprofen
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
## 2020-02-08 NOTE — Telephone Encounter (Signed)
Pt went to [facility] because we had no available appointments. They told him he needed a referral for a CT Scan to rule out a hernia.

## 2020-02-08 NOTE — Discharge Instructions (Signed)
Contact your primary care office to request a visit either virtually or in person to be evaluated for an outpatient CT scan given the symptoms she presented with today.  Given the limited setting of urgent care unable to provide advanced imaging to determine the exact cause of your symptoms however they are concerning for a possible hernia.  I recommend continuing the ibuprofen, and applying warm compresses to the area in which you are experiencing this pain.  As we discussed if symptoms become severe to go immediately to the emergency department however at present I feel following up with your primary care provider is definitely appropriate for further work-up of your current symptoms.

## 2020-02-08 NOTE — Telephone Encounter (Signed)
I will need to see him in the office. I do not see the ED work up  in [employer], yet. The ED could order the CT scan.  Can you triage his complaint?

## 2020-02-09 NOTE — Telephone Encounter (Signed)
UC note is available in chart now. Please advise.

## 2020-02-09 NOTE — Telephone Encounter (Signed)
Appointment has been scheduled for tomorrow at 0800.

## 2020-02-10 ENCOUNTER — Ambulatory Visit
Admission: RE | Admit: 2020-02-10 | Discharge: 2020-02-10 | Disposition: A | Discharge status: Home / Self Care | Payer: BC Managed Care – PPO | Source: Ambulatory Visit | Attending: Nurse Practitioner | Admitting: Nurse Practitioner

## 2020-02-10 ENCOUNTER — Other Ambulatory Visit: Payer: Self-pay

## 2020-02-10 ENCOUNTER — Ambulatory Visit: Payer: BC Managed Care – PPO | Admitting: Nurse Practitioner

## 2020-02-10 VITALS — BP 128/78 | HR 81 | Temp 97.6°F | Resp 16 | Ht 71.0 in | Wt 283.0 lb

## 2020-02-10 DIAGNOSIS — K5730 Diverticulosis of large intestine without perforation or abscess without bleeding: Secondary | ICD-10-CM | POA: Diagnosis not present

## 2020-02-10 DIAGNOSIS — R1031 Right lower quadrant pain: Secondary | ICD-10-CM | POA: Diagnosis not present

## 2020-02-10 DIAGNOSIS — K5901 Slow transit constipation: Secondary | ICD-10-CM

## 2020-02-10 DIAGNOSIS — L988 Other specified disorders of the skin and subcutaneous tissue: Secondary | ICD-10-CM

## 2020-02-10 DIAGNOSIS — K760 Fatty (change of) liver, not elsewhere classified: Secondary | ICD-10-CM | POA: Diagnosis not present

## 2020-02-10 DIAGNOSIS — Q8909 Congenital malformations of spleen: Secondary | ICD-10-CM | POA: Diagnosis not present

## 2020-02-10 DIAGNOSIS — D7389 Other diseases of spleen: Secondary | ICD-10-CM | POA: Diagnosis not present

## 2020-02-10 LAB — URINALYSIS, ROUTINE W REFLEX MICROSCOPIC
Bilirubin Urine: NEGATIVE
Ketones, ur: NEGATIVE
Leukocytes,Ua: NEGATIVE
Nitrite: NEGATIVE
Specific Gravity, Urine: 1.025 (ref 1.000–1.030)
Total Protein, Urine: NEGATIVE
Urine Glucose: NEGATIVE
Urobilinogen, UA: 0.2 (ref 0.0–1.0)
pH: 6.5 (ref 5.0–8.0)

## 2020-02-10 LAB — BASIC METABOLIC PANEL
BUN: 12 mg/dL (ref 6–23)
CO2: 25 mEq/L (ref 19–32)
Calcium: 9 mg/dL (ref 8.4–10.5)
Chloride: 105 mEq/L (ref 96–112)
Creatinine, Ser: 1.04 mg/dL (ref 0.40–1.50)
GFR: 83.31 mL/min (ref >60.00)
Glucose, Bld: 97 mg/dL (ref 70–99)
Potassium: 3.8 mEq/L (ref 3.5–5.1)
Sodium: 138 mEq/L (ref 135–145)

## 2020-02-10 LAB — CBC WITH DIFFERENTIAL/PLATELET
Basophils Absolute: 0.1 10*3/uL (ref 0.0–0.1)
Basophils Relative: 1 % (ref 0.0–3.0)
Eosinophils Absolute: 0.3 10*3/uL (ref 0.0–0.7)
Eosinophils Relative: 5.7 % — ABNORMAL HIGH (ref 0.0–5.0)
HCT: 43.4 % (ref 39.0–52.0)
Hemoglobin: 15 g/dL (ref 13.0–17.0)
Lymphocytes Relative: 32.2 % (ref 12.0–46.0)
Lymphs Abs: 1.9 10*3/uL (ref 0.7–4.0)
MCHC: 34.6 g/dL (ref 30.0–36.0)
MCV: 93.7 fl (ref 78.0–100.0)
Monocytes Absolute: 0.4 10*3/uL (ref 0.1–1.0)
Monocytes Relative: 7.4 % (ref 3.0–12.0)
Neutro Abs: 3.2 10*3/uL (ref 1.4–7.7)
Neutrophils Relative %: 53.7 % (ref 43.0–77.0)
Platelets: 292 10*3/uL (ref 150.0–400.0)
RBC: 4.63 Mil/uL (ref 4.22–5.81)
RDW: 12.7 % (ref 11.5–15.5)
WBC: 6 10*3/uL (ref 4.0–10.5)

## 2020-02-10 MED ORDER — IOHEXOL 300 MG/ML  SOLN
100.0000 mL | Freq: Once | INTRAMUSCULAR | Status: AC | PRN
  Administered 2020-02-10: 100 mL via INTRAVENOUS

## 2020-02-10 NOTE — Patient Instructions (Addendum)
For your right sided abdominal pain- I have ordered labs today and a CT abdomen/pelvis- you will be called about when to get that done.  further recommendations pending the results.     Abdominal Pain, Adult Pain in the abdomen (abdominal pain) can be caused by many things. Often, abdominal pain is not serious and it gets better with no treatment or by being treated at home. However, sometimes abdominal pain is serious. Your health care provider will ask questions about your medical history and do a physical exam to try to determine the cause of your abdominal pain. Follow these instructions at home:  Medicines  Take over-the-counter and prescription medicines only as told by your health care provider.  Do not take a laxative unless told by your health care provider. General instructions  Watch your condition for any changes.  Drink enough fluid to keep your urine pale yellow.  Keep all follow-up visits as told by your health care provider. This is important. Contact a health care provider if:  Your abdominal pain changes or gets worse.  You are not hungry or you lose weight without trying.  You are constipated or have diarrhea for more than 2-3 days.  You have pain when you urinate or have a bowel movement.  Your abdominal pain wakes you up at night.  Your pain gets worse with meals, after eating, or with certain foods.  You are vomiting and cannot keep anything down.  You have a fever.  You have blood in your urine. Get help right away if:  Your pain does not go away as soon as your health care provider told you to expect.  You cannot stop vomiting.  Your pain is only in areas of the abdomen, such as the right side or the left lower portion of the abdomen. Pain on the right side could be caused by appendicitis.  You have bloody or black stools, or stools that look like tar.  You have severe pain, cramping, or bloating in your abdomen.  You have signs of  dehydration, such as: ? Dark urine, very little urine, or no urine. ? Cracked lips. ? Dry mouth. ? Sunken eyes. ? Sleepiness. ? Weakness.  You have trouble breathing or chest pain. Summary  Often, abdominal pain is not serious and it gets better with no treatment or by being treated at home. However, sometimes abdominal pain is serious.  Watch your condition for any changes.  Take over-the-counter and prescription medicines only as told by your health care provider.  Contact a health care provider if your abdominal pain changes or gets worse.  Get help right away if you have severe pain, cramping, or bloating in your abdomen. This information is not intended to replace advice given to you by your health care provider. Make sure you discuss any questions you have with your health care provider. Document Revised: 11/24/2018 Document Reviewed: 11/24/2018 Elsevier Patient Education  2020 [employer].  Constipation, Adult Constipation is when a person has fewer bowel movements in a week than normal, has difficulty having a bowel movement, or has stools that are dry, hard, or larger than normal. Constipation may be caused by an underlying condition. It may become worse with age if a person takes certain medicines and does not take in enough fluids. Follow these instructions at home: Eating and drinking   Eat foods that have a lot of fiber, such as fresh fruits and vegetables, whole grains, and beans.  Limit foods that are high  in fat, low in fiber, or overly processed, such as french fries, hamburgers, cookies, candies, and soda.  Drink enough fluid to keep your urine clear or pale yellow. General instructions  Exercise regularly or as told by your health care provider.  Go to the restroom when you have the urge to go. Do not hold it in.  Take over-the-counter and prescription medicines only as told by your health care provider. These include any fiber supplements.  Practice  pelvic floor retraining exercises, such as deep breathing while relaxing the lower abdomen and pelvic floor relaxation during bowel movements.  Watch your condition for any changes.  Keep all follow-up visits as told by your health care provider. This is important. Contact a health care provider if:  You have pain that gets worse.  You have a fever.  You do not have a bowel movement after 4 days.  You vomit.  You are not hungry.  You lose weight.  You are bleeding from the anus.  You have thin, pencil-like stools. Get help right away if:  You have a fever and your symptoms suddenly get worse.  You leak stool or have blood in your stool.  Your abdomen is bloated.  You have severe pain in your abdomen.  You feel dizzy or you faint. This information is not intended to replace advice given to you by your health care provider. Make sure you discuss any questions you have with your health care provider. Document Revised: 06/28/2017 Document Reviewed: 01/04/2016 Elsevier Patient Education  2020 [employer].
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
## 2020-02-11 ENCOUNTER — Encounter: Payer: Self-pay | Admitting: Nurse Practitioner

## 2020-02-15 DIAGNOSIS — F3181 Bipolar II disorder: Secondary | ICD-10-CM | POA: Diagnosis not present

## 2020-02-16 ENCOUNTER — Other Ambulatory Visit: Payer: Self-pay

## 2020-02-16 DIAGNOSIS — Z0389 Encounter for observation for other suspected diseases and conditions ruled out: Secondary | ICD-10-CM | POA: Diagnosis not present

## 2020-02-16 DIAGNOSIS — Z79899 Other long term (current) drug therapy: Secondary | ICD-10-CM | POA: Diagnosis not present

## 2020-02-18 ENCOUNTER — Ambulatory Visit: Payer: BC Managed Care – PPO | Admitting: Nurse Practitioner

## 2020-02-18 ENCOUNTER — Ambulatory Visit: Payer: BC Managed Care – PPO

## 2020-02-18 ENCOUNTER — Other Ambulatory Visit: Payer: Self-pay

## 2020-02-18 ENCOUNTER — Encounter: Payer: Self-pay | Admitting: Nurse Practitioner

## 2020-02-18 VITALS — BP 122/80 | HR 106 | Temp 97.8°F | Ht 71.0 in | Wt 281.0 lb

## 2020-02-18 DIAGNOSIS — Z9189 Other specified personal risk factors, not elsewhere classified: Secondary | ICD-10-CM

## 2020-02-18 DIAGNOSIS — Z6839 Body mass index (BMI) 39.0-39.9, adult: Secondary | ICD-10-CM | POA: Diagnosis not present

## 2020-02-18 DIAGNOSIS — R1031 Right lower quadrant pain: Secondary | ICD-10-CM | POA: Diagnosis not present

## 2020-02-18 DIAGNOSIS — L988 Other specified disorders of the skin and subcutaneous tissue: Secondary | ICD-10-CM | POA: Diagnosis not present

## 2020-02-18 DIAGNOSIS — K760 Fatty (change of) liver, not elsewhere classified: Secondary | ICD-10-CM

## 2020-02-18 DIAGNOSIS — G4733 Obstructive sleep apnea (adult) (pediatric): Secondary | ICD-10-CM | POA: Diagnosis not present
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
## 2020-02-18 NOTE — Patient Instructions (Addendum)
Right sided abdominal pain when you bend and twist: Continue to monitor at this time could not lift any heavy objects right now, certainly cannot lift and.  Let me know if this is not resolved as expected.  With fatty liver, we talked about 10% weight loss.  You seem to be on a good path.  See enclosed handouts. Diet and Exercise: Healthy lifestyle including appropriate food choices and regular exercise. Eat a diet that is moderate in fiber with fresh fruits and vegetables and low in saturated fats and simple carbohydrates. I recommended getting exercise daily and vigorous exercise 3 times per week to include a total of at least 150 minutes per week.  The overall mental health seems to be in a good place, continue to follow-up with your therapist as you need to.  Follow-up office visit in 3 months and will recheck labs see how you are getting along with the diet management for fatty liver.     Fatty Liver Disease  Fatty liver disease occurs when too much fat has built up in your liver cells. Fatty liver disease is also called hepatic steatosis or steatohepatitis. The liver removes harmful substances from your bloodstream and produces fluids that your body needs. It also helps your body use and store energy from the food you eat. In many cases, fatty liver disease does not cause symptoms or problems. It is often diagnosed when tests are being done for other reasons. However, over time, fatty liver can cause inflammation that may lead to more serious liver problems, such as scarring of the liver (cirrhosis) and liver failure. Fatty liver is associated with insulin resistance, increased body fat, high blood pressure (hypertension), and high cholesterol. These are features of metabolic syndrome and increase your risk for stroke, diabetes, and heart disease. What are the causes? This condition may be caused by:  Drinking too much alcohol.  Poor nutrition.  Obesity.  Cushing's  syndrome.  Diabetes.  High cholesterol.  Certain drugs.  Poisons.  Some viral infections.  Pregnancy. What increases the risk? You are more likely to develop this condition if you:  Abuse alcohol.  Are overweight.  Have diabetes.  Have hepatitis.  Have a high triglyceride level.  Are pregnant. What are the signs or symptoms? Fatty liver disease often does not cause symptoms. If symptoms do develop, they can include:  Fatigue.  Weakness.  Weight loss.  Confusion.  Abdominal pain.  Nausea and vomiting.  Yellowing of your skin and the white parts of your eyes (jaundice).  Itchy skin. How is this diagnosed? This condition may be diagnosed by:  A physical exam and medical history.  Blood tests.  Imaging tests, such as an ultrasound, CT scan, or MRI.  A liver biopsy. A small sample of liver tissue is removed using a needle. The sample is then looked at under a microscope. How is this treated? Fatty liver disease is often caused by other health conditions. Treatment for fatty liver may involve medicines and lifestyle changes to manage conditions such as:  Alcoholism.  High cholesterol.  Diabetes.  Being overweight or obese. Follow these instructions at home:   Do not drink alcohol. If you have trouble quitting, ask your health care provider how to safely quit with the help of medicine or a supervised program. This is important to keep your condition from getting worse.  Eat a healthy diet as told by your health care provider. Ask your health care provider about working with a diet and nutrition  specialist (dietitian) to develop an eating plan.  Exercise regularly. This can help you lose weight and control your cholesterol and diabetes. Talk to your health care provider about an exercise plan and which activities are best for you.  Take over-the-counter and prescription medicines only as told by your health care provider.  Keep all follow-up visits  as told by your health care provider. This is important. Contact a health care provider if: You have trouble controlling your:  Blood sugar. This is especially important if you have diabetes.  Cholesterol.  Drinking of alcohol. Get help right away if:  You have abdominal pain.  You have jaundice.  You have nausea and vomiting.  You vomit blood or material that looks like coffee grounds.  You have stools that are black, tar-like, or bloody. Summary  Fatty liver disease develops when too much fat builds up in the cells of your liver.  Fatty liver disease often causes no symptoms or problems. However, over time, fatty liver can cause inflammation that may lead to more serious liver problems, such as scarring of the liver (cirrhosis).  You are more likely to develop this condition if you abuse alcohol, are pregnant, are overweight, have diabetes, have hepatitis, or have high triglyceride levels.  Contact your health care provider if you have trouble controlling your weight, blood sugar, cholesterol, or drinking of alcohol. This information is not intended to replace advice given to you by your health care provider. Make sure you discuss any questions you have with your health care provider. Document Revised: 06/28/2017 Document Reviewed: 04/24/2017 Elsevier Patient Education  2020 [employer]  Abdominal Pain, Adult Pain in the abdomen (abdominal pain) can be caused by many things. Often, abdominal pain is not serious and it gets better with no treatment or by being treated at home. However, sometimes abdominal pain is serious. Your health care provider will ask questions about your medical history and do a physical exam to try to determine the cause of your abdominal pain. Follow these instructions at home:  Medicines  Take over-the-counter and prescription medicines only as told by your health care provider.  Do not take a laxative unless told by your health care  provider. General instructions  Watch your condition for any changes.  Drink enough fluid to keep your urine pale yellow.  Keep all follow-up visits as told by your health care provider. This is important. Contact a health care provider if:  Your abdominal pain changes or gets worse.  You are not hungry or you lose weight without trying.  You are constipated or have diarrhea for more than 2-3 days.  You have pain when you urinate or have a bowel movement.  Your abdominal pain wakes you up at night.  Your pain gets worse with meals, after eating, or with certain foods.  You are vomiting and cannot keep anything down.  You have a fever.  You have blood in your urine. Get help right away if:  Your pain does not go away as soon as your health care provider told you to expect.  You cannot stop vomiting.  Your pain is only in areas of the abdomen, such as the right side or the left lower portion of the abdomen. Pain on the right side could be caused by appendicitis.  You have bloody or black stools, or stools that look like tar.  You have severe pain, cramping, or bloating in your abdomen.  You have signs of dehydration, such as: ? Dark  urine, very little urine, or no urine. ? Cracked lips. ? Dry mouth. ? Sunken eyes. ? Sleepiness. ? Weakness.  You have trouble breathing or chest pain. Summary  Often, abdominal pain is not serious and it gets better with no treatment or by being treated at home. However, sometimes abdominal pain is serious.  Watch your condition for any changes.  Take over-the-counter and prescription medicines only as told by your health care provider.  Contact a health care provider if your abdominal pain changes or gets worse.  Get help right away if you have severe pain, cramping, or bloating in your abdomen. This information is not intended to replace advice given to you by your health care provider. Make sure you discuss any questions you  have with your health care provider. Document Revised: 11/24/2018 Document Reviewed: 11/24/2018 Elsevier Patient Education  2020 [employer].

## 2020-02-21 ENCOUNTER — Encounter: Payer: Self-pay | Admitting: Nurse Practitioner

## 2020-02-27 DIAGNOSIS — G4733 Obstructive sleep apnea (adult) (pediatric): Secondary | ICD-10-CM | POA: Diagnosis not present

## 2020-03-10 DIAGNOSIS — L0501 Pilonidal cyst with abscess: Secondary | ICD-10-CM | POA: Diagnosis not present

## 2020-03-11 ENCOUNTER — Other Ambulatory Visit: Payer: Self-pay | Admitting: General Surgery
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
## 2020-03-15 ENCOUNTER — Telehealth: Payer: Self-pay | Admitting: Pulmonary Disease

## 2020-03-15 DIAGNOSIS — G4733 Obstructive sleep apnea (adult) (pediatric): Secondary | ICD-10-CM

## 2020-03-15 NOTE — Telephone Encounter (Signed)
03/15/2020  Patient's home sleep study results are listed below:  Home sleep study 02/18/20 showed mild obstructive sleep apnea with an AHI of 6.9 and SpO2 low of 85%.   Would recommend appointment with APP or Dr. Craige Cotta to further review and discuss.  Patient with mild obstructive sleep apnea.  Interventions could be considered such as: Weight loss, oral appliance or CPAP therapy.  [name], FNP

## 2020-03-16 NOTE — Telephone Encounter (Signed)
Ok   Ok to place order:   APAP  5-15 Mask of choice  Dme: [facility]lies   Follow up in 2-3 months to show 30d cpap use   [name] FNP

## 2020-03-16 NOTE — Telephone Encounter (Signed)
Order has been placed to [facility] for cpap. Patient is aware and voiced his understanding.  Recall has been placed for 48mo with Dr. Craige Cotta. Nothing further is needed.

## 2020-03-16 NOTE — Telephone Encounter (Signed)
Called and spoke to patient and relayed below results.  Patient would like to go ahead and proceed with cpap therapy. He is scheduled for surgery next week and would like to call back and schedule an appointment after recovery.   Austin Murray, please advise. Thanks

## 2020-03-17 ENCOUNTER — Encounter
Admission: RE | Admit: 2020-03-17 | Discharge: 2020-03-17 | Disposition: A | Discharge status: Home / Self Care | Payer: BC Managed Care – PPO | Source: Ambulatory Visit | Attending: General Surgery | Admitting: General Surgery

## 2020-03-17 ENCOUNTER — Other Ambulatory Visit: Payer: Self-pay

## 2020-03-17 HISTORY — DX: Pneumonia, unspecified organism: J18.9

## 2020-03-17 HISTORY — DX: Sleep apnea, unspecified: G47.30

## 2020-03-17 HISTORY — DX: Methicillin resistant Staphylococcus aureus infection, unspecified site: A49.02

## 2020-03-17 NOTE — Patient Instructions (Signed)
Your procedure is scheduled on: 03/23/20 Report to DAY SURGERY DEPARTMENT LOCATED ON 2ND FLOOR MEDICAL MALL ENTRANCE. To find out your arrival time please call [phone] between 1PM - 3PM on 03/22/20.  Remember: Instructions that are not followed completely may result in serious medical risk, up to and including death, or upon the discretion of your surgeon and anesthesiologist your surgery may need to be rescheduled.     _X__ 1. Do not eat food after midnight the night before your procedure.                 No gum chewing or hard candies. You may drink clear liquids up to 2 hours                 before you are scheduled to arrive for your surgery- DO not drink clear                 liquids within 2 hours of the start of your surgery.                 Clear Liquids include:  water, apple juice without pulp, clear carbohydrate                 drink such as Clearfast or Gatorade, Black Coffee or Tea (Do not add                 anything to coffee or tea). Diabetics water only  __X__2.  On the morning of surgery brush your teeth with toothpaste and water, you                 may rinse your mouth with mouthwash if you wish.  Do not swallow any              toothpaste of mouthwash.     _X__ 3.  No Alcohol for 24 hours before or after surgery.   _X__ 4.  Do Not Smoke or use e-cigarettes For 24 Hours Prior to Your Surgery.                 Do not use any chewable tobacco products for at least 6 hours prior to                 surgery.  ____  5.  Bring all medications with you on the day of surgery if instructed.   __X__  6.  Notify your doctor if there is any change in your medical condition      (cold, fever, infections).     Do not wear jewelry, make-up, hairpins, clips or nail polish. Do not wear lotions, powders, or perfumes.  Do not shave 48 hours prior to surgery. Men may shave face and neck. Do not bring valuables to the hospital.    [facility] is not responsible for any belongings or  valuables.  Contacts, dentures/partials or body piercings may not be worn into surgery. Bring a case for your contacts, glasses or hearing aids, a denture cup will be supplied. Leave your suitcase in the car. After surgery it may be brought to your room. For patients admitted to the hospital, discharge time is determined by your treatment team.   Patients discharged the day of surgery will not be allowed to drive home.   Please read over the following fact sheets that you were given:   MRSA Information  __X__ Take these medicines the morning of surgery with A SIP OF WATER:  1. none  2.   3.   4.  5.  6.  ____ Fleet Enema (as directed)   __X__ Use CHG Soap/SAGE wipes as directed  ____ Use inhalers on the day of surgery  ____ Stop metformin/Janumet/Farxiga 2 days prior to surgery    ____ Take 1/2 of usual insulin dose the night before surgery. No insulin the morning          of surgery.   ____ Stop Blood Thinners Coumadin/Plavix/Xarelto/Pleta/Pradaxa/Eliquis/Effient/Aspirin  on   Or contact your Surgeon, Cardiologist or Medical Doctor regarding  ability to stop your blood thinners  __X__ Stop Anti-inflammatories 7 days before surgery such as Advil, Ibuprofen, Motrin,  BC or Goodies Powder, Naprosyn, Naproxen, Aleve, Aspirin    __X__ Stop all herbal supplements, fish oil or vitamin E until after surgery.    ____ Bring C-Pap to the hospital.    How to Use Chlorhexidine for Bathing Chlorhexidine gluconate (CHG) is a germ-killing (antiseptic) solution that is used to clean the skin. It can get rid of the bacteria that normally live on the skin and can keep them away for about 24 hours. To clean your skin with CHG, you may be given:  A CHG solution to use in the shower or as part of a sponge bath.  A prepackaged cloth that contains CHG. Cleaning your skin with CHG may help lower the risk for infection:  While you are staying in the intensive care unit of the  hospital.  If you have a vascular access, such as a central line, to provide short-term or long-term access to your veins.  If you have a catheter to drain urine from your bladder.  If you are on a ventilator. A ventilator is a machine that helps you breathe by moving air in and out of your lungs.  After surgery. What are the risks? Risks of using CHG include:  A skin reaction.  Hearing loss, if CHG gets in your ears.  Eye injury, if CHG gets in your eyes and is not rinsed out.  The CHG product catching fire. Make sure that you avoid smoking and flames after applying CHG to your skin. Do not use CHG:  If you have a chlorhexidine allergy or have previously reacted to chlorhexidine.  On babies younger than 49 months of age. How to use CHG solution  Use CHG only as told by your health care provider, and follow the instructions on the label.  Use the full amount of CHG as directed. Usually, this is one bottle. During a shower Follow these steps when using CHG solution during a shower (unless your health care provider gives you different instructions): 1. Start the shower. 2. Use your normal soap and shampoo to wash your face and hair. 3. Turn off the shower or move out of the shower stream. 4. Pour the CHG onto a clean washcloth. Do not use any type of brush or rough-edged sponge. 5. Starting at your neck, lather your body down to your toes. Make sure you follow these instructions: ? If you will be having surgery, pay special attention to the part of your body where you will be having surgery. Scrub this area for at least 1 minute. ? Do not use CHG on your head or face. If the solution gets into your ears or eyes, rinse them well with water. ? Avoid your genital area. ? Avoid any areas of skin that have broken skin, cuts, or scrapes. ? Scrub your back and under  your arms. Make sure to wash skin folds. 6. Let the lather sit on your skin for 1-2 minutes or as long as told by your  health care provider. 7. Thoroughly rinse your entire body in the shower. Make sure that all body creases and crevices are rinsed well. 8. Dry off with a clean towel. Do not put any substances on your body afterward--such as powder, lotion, or perfume--unless you are told to do so by your health care provider.  9. Put on clean clothes or pajamas. 10. If it is the night before your surgery, sleep in clean sheets. 11. Repeat the morning of surgery

## 2020-03-21 ENCOUNTER — Other Ambulatory Visit: Payer: Self-pay

## 2020-03-21 ENCOUNTER — Other Ambulatory Visit
Admission: RE | Admit: 2020-03-21 | Discharge: 2020-03-21 | Disposition: A | Discharge status: Home / Self Care | Payer: BC Managed Care – PPO | Source: Ambulatory Visit | Attending: General Surgery | Admitting: General Surgery

## 2020-03-21 DIAGNOSIS — Z20822 Contact with and (suspected) exposure to covid-19: Secondary | ICD-10-CM | POA: Diagnosis not present

## 2020-03-21 DIAGNOSIS — Z01812 Encounter for preprocedural laboratory examination: Secondary | ICD-10-CM | POA: Diagnosis not present

## 2020-03-21 LAB — SARS CORONAVIRUS 2 (TAT 6-24 HRS): SARS Coronavirus 2: NEGATIVE

## 2020-03-23 ENCOUNTER — Ambulatory Visit: Payer: BC Managed Care – PPO | Admitting: Anesthesiology

## 2020-03-23 ENCOUNTER — Ambulatory Visit
Admission: RE | Admit: 2020-03-23 | Discharge: 2020-03-23 | Disposition: A | Discharge status: Home / Self Care | Payer: BC Managed Care – PPO | Attending: General Surgery | Admitting: General Surgery

## 2020-03-23 ENCOUNTER — Encounter
Admission: RE | Disposition: A | Discharge status: Home / Self Care | Payer: Self-pay | Source: Home / Self Care | Attending: General Surgery

## 2020-03-23 ENCOUNTER — Encounter: Payer: Self-pay | Admitting: General Surgery

## 2020-03-23 ENCOUNTER — Other Ambulatory Visit: Payer: Self-pay

## 2020-03-23 DIAGNOSIS — F319 Bipolar disorder, unspecified: Secondary | ICD-10-CM | POA: Diagnosis not present

## 2020-03-23 DIAGNOSIS — Z79899 Other long term (current) drug therapy: Secondary | ICD-10-CM | POA: Diagnosis not present

## 2020-03-23 DIAGNOSIS — F17210 Nicotine dependence, cigarettes, uncomplicated: Secondary | ICD-10-CM | POA: Diagnosis not present

## 2020-03-23 DIAGNOSIS — L0501 Pilonidal cyst with abscess: Secondary | ICD-10-CM | POA: Diagnosis not present

## 2020-03-23 DIAGNOSIS — G4730 Sleep apnea, unspecified: Secondary | ICD-10-CM | POA: Diagnosis not present

## 2020-03-23 DIAGNOSIS — L0591 Pilonidal cyst without abscess: Secondary | ICD-10-CM | POA: Diagnosis not present

## 2020-03-23 HISTORY — PX: PILONIDAL CYST EXCISION: SHX744

## 2020-03-23 LAB — URINE DRUG SCREEN, QUALITATIVE (ARMC ONLY)
Amphetamines, Ur Screen: NOT DETECTED
Barbiturates, Ur Screen: NOT DETECTED
Benzodiazepine, Ur Scrn: NOT DETECTED
Cannabinoid 50 Ng, Ur ~~LOC~~: POSITIVE — AB
Cocaine Metabolite,Ur ~~LOC~~: NOT DETECTED
MDMA (Ecstasy)Ur Screen: NOT DETECTED
Methadone Scn, Ur: NOT DETECTED
Opiate, Ur Screen: NOT DETECTED
Phencyclidine (PCP) Ur S: NOT DETECTED
Tricyclic, Ur Screen: NOT DETECTED

## 2020-03-23 SURGERY — EXCISION, PILONIDAL CYST, EXTENSIVE
Anesthesia: General

## 2020-03-23 MED ORDER — SODIUM CHLORIDE 0.9 % IV SOLN
1.0000 g | INTRAVENOUS | Status: AC
  Administered 2020-03-23: 1 g via INTRAVENOUS
  Filled 2020-03-23: qty 1

## 2020-03-23 MED ORDER — LIDOCAINE HCL (CARDIAC) PF 100 MG/5ML IV SOSY
PREFILLED_SYRINGE | INTRAVENOUS | Status: DC | PRN
  Administered 2020-03-23: 60 mg via INTRATRACHEAL

## 2020-03-23 MED ORDER — LIDOCAINE HCL (PF) 1 % IJ SOLN
INTRAMUSCULAR | Status: DC | PRN
  Administered 2020-03-23: 20 mL

## 2020-03-23 MED ORDER — ONDANSETRON HCL 4 MG/2ML IJ SOLN
INTRAMUSCULAR | Status: AC
  Filled 2020-03-23: qty 2

## 2020-03-23 MED ORDER — CHLORHEXIDINE GLUCONATE 0.12 % MT SOLN
OROMUCOSAL | Status: AC
  Administered 2020-03-23: 15 mL via OROMUCOSAL
  Filled 2020-03-23: qty 15

## 2020-03-23 MED ORDER — FENTANYL CITRATE (PF) 100 MCG/2ML IJ SOLN
INTRAMUSCULAR | Status: AC
  Filled 2020-03-23: qty 2

## 2020-03-23 MED ORDER — PROPOFOL 10 MG/ML IV BOLUS
INTRAVENOUS | Status: DC | PRN
  Administered 2020-03-23 (×2): 20 mg via INTRAVENOUS

## 2020-03-23 MED ORDER — ORAL CARE MOUTH RINSE: 15.0000 mL | Freq: Once | OROMUCOSAL | Status: AC

## 2020-03-23 MED ORDER — ACETAMINOPHEN 10 MG/ML IV SOLN
INTRAVENOUS | Status: AC
  Filled 2020-03-23: qty 100

## 2020-03-23 MED ORDER — PROMETHAZINE HCL 25 MG/ML IJ SOLN: 6.2500 mg | INTRAMUSCULAR | Status: DC | PRN

## 2020-03-23 MED ORDER — CHLORHEXIDINE GLUCONATE 0.12 % MT SOLN: 15.0000 mL | Freq: Once | OROMUCOSAL | Status: AC

## 2020-03-23 MED ORDER — ACETAMINOPHEN 10 MG/ML IV SOLN
INTRAVENOUS | Status: DC | PRN
  Administered 2020-03-23: 1000 mg via INTRAVENOUS

## 2020-03-23 MED ORDER — FAMOTIDINE 20 MG PO TABS
ORAL_TABLET | ORAL | Status: AC
  Administered 2020-03-23: 20 mg via ORAL
  Filled 2020-03-23: qty 1

## 2020-03-23 MED ORDER — BUPIVACAINE HCL (PF) 0.5 % IJ SOLN
INTRAMUSCULAR | Status: AC
  Filled 2020-03-23: qty 30

## 2020-03-23 MED ORDER — KETOROLAC TROMETHAMINE 30 MG/ML IJ SOLN
INTRAMUSCULAR | Status: DC | PRN
  Administered 2020-03-23: 30 mg via INTRAVENOUS

## 2020-03-23 MED ORDER — LIDOCAINE-EPINEPHRINE 1 %-1:100000 IJ SOLN
INTRAMUSCULAR | Status: AC
  Filled 2020-03-23: qty 1

## 2020-03-23 MED ORDER — BUPIVACAINE HCL (PF) 0.5 % IJ SOLN
INTRAMUSCULAR | Status: DC | PRN
  Administered 2020-03-23: 30 mL

## 2020-03-23 MED ORDER — FENTANYL CITRATE (PF) 100 MCG/2ML IJ SOLN: 25.0000 ug | INTRAMUSCULAR | Status: DC | PRN

## 2020-03-23 MED ORDER — KETOROLAC TROMETHAMINE 30 MG/ML IJ SOLN
INTRAMUSCULAR | Status: AC
  Filled 2020-03-23: qty 1

## 2020-03-23 MED ORDER — PROPOFOL 10 MG/ML IV BOLUS
INTRAVENOUS | Status: AC
  Filled 2020-03-23: qty 20

## 2020-03-23 MED ORDER — PROPOFOL 500 MG/50ML IV EMUL
INTRAVENOUS | Status: DC | PRN
  Administered 2020-03-23: 50 ug/kg/min via INTRAVENOUS

## 2020-03-23 MED ORDER — DEXMEDETOMIDINE HCL 200 MCG/2ML IV SOLN
INTRAVENOUS | Status: DC | PRN
  Administered 2020-03-23: 4 ug via INTRAVENOUS
  Administered 2020-03-23: 8 ug via INTRAVENOUS
  Administered 2020-03-23 (×2): 4 ug via INTRAVENOUS

## 2020-03-23 MED ORDER — MIDAZOLAM HCL 2 MG/2ML IJ SOLN
INTRAMUSCULAR | Status: DC | PRN
  Administered 2020-03-23: 2 mg via INTRAVENOUS

## 2020-03-23 MED ORDER — FAMOTIDINE 20 MG PO TABS: 20.0000 mg | ORAL_TABLET | Freq: Once | ORAL | Status: AC

## 2020-03-23 MED ORDER — HYDROCODONE-ACETAMINOPHEN 5-325 MG PO TABS: 1.0000 | ORAL_TABLET | ORAL | 0 refills | Status: DC | PRN

## 2020-03-23 MED ORDER — FENTANYL CITRATE (PF) 100 MCG/2ML IJ SOLN
INTRAMUSCULAR | Status: DC | PRN
Start: 2020-03-23 — End: 2020-03-23
  Administered 2020-03-23: 50 ug via INTRAVENOUS

## 2020-03-23 MED ORDER — HYDROCODONE-ACETAMINOPHEN 5-325 MG PO TABS
1.0000 | ORAL_TABLET | ORAL | 0 refills | Status: DC | PRN
Start: 2020-03-23 — End: 2020-05-16

## 2020-03-23 MED ORDER — DEXMEDETOMIDINE HCL IN NACL 80 MCG/20ML IV SOLN
INTRAVENOUS | Status: AC
  Filled 2020-03-23: qty 20

## 2020-03-23 MED ORDER — LIDOCAINE HCL (PF) 1 % IJ SOLN
INTRAMUSCULAR | Status: AC
  Filled 2020-03-23: qty 30

## 2020-03-23 MED ORDER — MIDAZOLAM HCL 2 MG/2ML IJ SOLN
INTRAMUSCULAR | Status: AC
  Filled 2020-03-23: qty 2

## 2020-03-23 MED ORDER — LIDOCAINE-EPINEPHRINE 1 %-1:100000 IJ SOLN
INTRAMUSCULAR | Status: DC | PRN
  Administered 2020-03-23: 20 mL

## 2020-03-23 MED ORDER — LACTATED RINGERS IV SOLN: INTRAVENOUS | Status: DC

## 2020-03-23 SURGICAL SUPPLY — 37 items
BLADE SURG 11 STRL SS SAFETY (MISCELLANEOUS) ×3 IMPLANT
BLADE SURG 15 STRL SS SAFETY (BLADE) ×3 IMPLANT
CANISTER SUCT 1200ML W/VALVE (MISCELLANEOUS) ×3 IMPLANT
CLOSURE WOUND 1/2 X4 (GAUZE/BANDAGES/DRESSINGS) ×1
COVER WAND RF STERILE (DRAPES) ×3 IMPLANT
DRAPE LAPAROTOMY 100X77 ABD (DRAPES) ×3 IMPLANT
DRSG TEGADERM 4X4.75 (GAUZE/BANDAGES/DRESSINGS) ×3 IMPLANT
DRSG TELFA 3X8 NADH (GAUZE/BANDAGES/DRESSINGS) ×3 IMPLANT
ELECT CAUTERY BLADE TIP 2.5 (TIP) ×3
ELECT REM PT RETURN 9FT ADLT (ELECTROSURGICAL) ×3
ELECTRODE CAUTERY BLDE TIP 2.5 (TIP) ×1 IMPLANT
ELECTRODE REM PT RTRN 9FT ADLT (ELECTROSURGICAL) ×1 IMPLANT
GLOVE BIO SURGEON STRL SZ7.5 (GLOVE) ×3 IMPLANT
GLOVE INDICATOR 8.0 STRL GRN (GLOVE) ×3 IMPLANT
GOWN STRL REUS W/ TWL LRG LVL3 (GOWN DISPOSABLE) ×2 IMPLANT
GOWN STRL REUS W/TWL LRG LVL3 (GOWN DISPOSABLE) ×6
KIT TURNOVER KIT A (KITS) ×3 IMPLANT
LABEL OR SOLS (LABEL) ×3 IMPLANT
NEEDLE HYPO 22GX1.5 SAFETY (NEEDLE) ×3 IMPLANT
NEEDLE HYPO 25X1 1.5 SAFETY (NEEDLE) ×3 IMPLANT
NS IRRIG 500ML POUR BTL (IV SOLUTION) ×3 IMPLANT
PACK BASIN MINOR (MISCELLANEOUS) ×3 IMPLANT
SOL PREP PVP 2OZ (MISCELLANEOUS) ×3
SOLUTION PREP PVP 2OZ (MISCELLANEOUS) ×1 IMPLANT
STRIP CLOSURE SKIN 1/2X4 (GAUZE/BANDAGES/DRESSINGS) ×2 IMPLANT
SUT ETHILON 4-0 (SUTURE) ×6
SUT ETHILON 4-0 FS2 18XMFL BLK (SUTURE) ×2
SUT PROLENE 4-0 (SUTURE) ×3
SUT PROLENE 4-0 RB1 30XMFL BLU (SUTURE) ×1
SUT VIC AB 2-0 CT1 (SUTURE) ×3 IMPLANT
SUT VIC AB 2-0 CT2 27 (SUTURE) IMPLANT
SUT VICRYL 3-0 27IN (SUTURE) ×3 IMPLANT
SUT VICRYL+ 3-0 144IN (SUTURE) ×3 IMPLANT
SUTURE ETHLN 4-0 FS2 18XMF BLK (SUTURE) ×2 IMPLANT
SUTURE PROLEN 4-0 RB1 30XMFL (SUTURE) ×1 IMPLANT
SWABSTK COMLB BENZOIN TINCTURE (MISCELLANEOUS) ×3 IMPLANT
SYR 10ML LL (SYRINGE) ×6 IMPLANT

## 2020-03-23 NOTE — Discharge Instructions (Signed)
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
## 2020-03-23 NOTE — Transfer of Care (Signed)
Immediate Anesthesia Transfer of Care Note  Patient: Austin Murray  Procedure(s) Performed: CYST EXCISION PILONIDAL EXTENSIVE (N/A )  Patient Location: PACU  Anesthesia Type:General  Level of Consciousness: awake, alert  and oriented  Airway & Oxygen Therapy: Patient Spontaneous Breathing  Post-op Assessment: Report given to RN and Post -op Vital signs reviewed and stable  Post vital signs: Reviewed and stable  Last Vitals:  Vitals Value Taken Time  BP 115/68 03/23/20 1657  Temp    Pulse 83 03/23/20 1659  Resp 13 03/23/20 1659  SpO2 99 % 03/23/20 1659  Vitals shown include unvalidated device data.  Last Pain:  Vitals:   03/23/20 1326  TempSrc: Temporal  PainSc: 0-No pain         Complications: No complications documented.
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
## 2020-03-23 NOTE — OR Nursing (Signed)
Dr. Lemar Livings saw patient in postop on arrival.

## 2020-03-23 NOTE — Op Note (Signed)
Preoperative diagnosis: Pilonidal cyst with abscess.  Postoperative diagnosis: Same.  Operative procedure: Pilonidal cystectomy.  Operating surgeon: Donnalee Curry, MD.  Anesthesia: Attended local, 20 cc 1% Xylocaine with 1: 200,000 units of epinephrine, 10 cc 1% Xylocaine, plain; 30 cc 0.5% Marcaine with 1: 200,000's of epinephrine.  Estimated blood loss: Less than 5 cc.  Clinical note: This 29 year old male has a longstanding pilonidal cyst with recurrent abscess formation.  Examination showed 2 draining sites.  He was admitted for planned cystectomy.  He has been treated with Bactrim for suppressive therapy.  Operative note: Patient was placed prone on the operating room table and padded appropriately.  The buttocks were taped apart.  Field block anesthesia was achieved with the above-mentioned local anesthetic and IV sedation.  This was completed after cleansing with Betadine solution.  The 2 draining sites were excised elliptical incision.  There were 3 small "pits" below the inferior draining site and this was removed through an elliptical incision.  The bridge of tissue which was about 3-4 cm between the draining sites was opened to allow complete debridement with the curette.  The chronic healthy scar was crosshatched with cautery and the tissue approximated with interrupted 2-0 Vicryl figure-of-eight sutures after achieving good hemostasis.  The skin was approximated with interrupted 4-0 nylon horizontal mattress sutures.  Telfa and Tegaderm dressing applied.  The patient tolerated the procedure well and was taken to recovery room in stable condition.

## 2020-03-23 NOTE — Anesthesia Postprocedure Evaluation (Signed)
Anesthesia Post Note  Patient: Austin Murray  Procedure(s) Performed: CYST EXCISION PILONIDAL EXTENSIVE (N/A )  Patient location during evaluation: PACU Anesthesia Type: General Level of consciousness: awake and alert Pain management: pain level controlled Vital Signs Assessment: post-procedure vital signs reviewed and stable Respiratory status: spontaneous breathing and respiratory function stable Cardiovascular status: stable Anesthetic complications: no   No complications documented.   Last Vitals:  Vitals:   03/23/20 1727 03/23/20 1740  BP: 112/77 120/69  Pulse: 92 70  Resp: 14 16  Temp:  36.8 C  SpO2: 97% 100%    Last Pain:  Vitals:   03/23/20 1740  TempSrc: Temporal  PainSc: 0-No pain                 Elon Eoff K

## 2020-03-23 NOTE — Anesthesia Procedure Notes (Signed)
Date/Time: 03/23/2020 4:00 PM Performed by: Henrietta Hoover, CRNA Pre-anesthesia Checklist: Patient identified, Emergency Drugs available, Suction available, Patient being monitored and Timeout performed Patient Re-evaluated:Patient Re-evaluated prior to induction Oxygen Delivery Method: Simple face mask Placement Confirmation: positive ETCO2

## 2020-03-24 ENCOUNTER — Encounter: Payer: Self-pay | Admitting: General Surgery

## 2020-03-24 DIAGNOSIS — F3181 Bipolar II disorder: Secondary | ICD-10-CM | POA: Diagnosis not present

## 2020-03-24 DIAGNOSIS — F1120 Opioid dependence, uncomplicated: Secondary | ICD-10-CM | POA: Diagnosis not present

## 2020-03-25 LAB — SURGICAL PATHOLOGY

## 2020-03-30 LAB — AEROBIC/ANAEROBIC CULTURE W GRAM STAIN (SURGICAL/DEEP WOUND)

## 2020-03-31 DIAGNOSIS — Z79899 Other long term (current) drug therapy: Secondary | ICD-10-CM | POA: Diagnosis not present

## 2020-03-31 DIAGNOSIS — Z0389 Encounter for observation for other suspected diseases and conditions ruled out: Secondary | ICD-10-CM | POA: Diagnosis not present

## 2020-04-22 DIAGNOSIS — F1120 Opioid dependence, uncomplicated: Secondary | ICD-10-CM | POA: Diagnosis not present

## 2020-04-22 DIAGNOSIS — F3181 Bipolar II disorder: Secondary | ICD-10-CM | POA: Diagnosis not present

## 2020-04-26 DIAGNOSIS — Z5181 Encounter for therapeutic drug level monitoring: Secondary | ICD-10-CM | POA: Diagnosis not present

## 2020-05-16 ENCOUNTER — Telehealth: Payer: Self-pay | Admitting: Pulmonary Disease

## 2020-05-16 ENCOUNTER — Other Ambulatory Visit: Payer: Self-pay

## 2020-05-16 ENCOUNTER — Ambulatory Visit (INDEPENDENT_AMBULATORY_CARE_PROVIDER_SITE_OTHER): Payer: BC Managed Care – PPO | Admitting: Nurse Practitioner

## 2020-05-16 ENCOUNTER — Encounter: Payer: Self-pay | Admitting: Nurse Practitioner

## 2020-05-16 VITALS — BP 130/78 | HR 96 | Temp 98.5°F | Ht 71.0 in | Wt 284.0 lb

## 2020-05-16 DIAGNOSIS — G43009 Migraine without aura, not intractable, without status migrainosus: Secondary | ICD-10-CM

## 2020-05-16 DIAGNOSIS — K760 Fatty (change of) liver, not elsewhere classified: Secondary | ICD-10-CM | POA: Diagnosis not present

## 2020-05-16 DIAGNOSIS — Z6839 Body mass index (BMI) 39.0-39.9, adult: Secondary | ICD-10-CM | POA: Diagnosis not present

## 2020-05-16 DIAGNOSIS — E559 Vitamin D deficiency, unspecified: Secondary | ICD-10-CM

## 2020-05-16 DIAGNOSIS — G4730 Sleep apnea, unspecified: Secondary | ICD-10-CM | POA: Diagnosis not present

## 2020-05-16 NOTE — Telephone Encounter (Signed)
05/16/2020  Received communication from primary care office from patient.  Patient never picked up CPAP.  He is wondering who he needs to contact for this.  He is still interested in resuming CPAP.  Can you please contact the patient as well as [facility] in [place], [place] to coordinate.  [name], FNP

## 2020-05-16 NOTE — Telephone Encounter (Signed)
Patient has been provided with [facility]'s contact number. Patient will contact [facility] to reschedule cpap setup. Nothing further needed.

## 2020-05-16 NOTE — Patient Instructions (Addendum)
Try Magox- 400 mg daily to prevent headaches.   Vit D 3 at 800- 1000 IU daily  Please continue with healthy diet as you are doing and add in exercise as tolerated after your  cyst surgery.  Please f/up in 6 months      Fatty Liver Disease  Fatty liver disease occurs when too much fat has built up in your liver cells. Fatty liver disease is also called hepatic steatosis or steatohepatitis. The liver removes harmful substances from your bloodstream and produces fluids that your body needs. It also helps your body use and store energy from the food you eat. In many cases, fatty liver disease does not cause symptoms or problems. It is often diagnosed when tests are being done for other reasons. However, over time, fatty liver can cause inflammation that may lead to more serious liver problems, such as scarring of the liver (cirrhosis) and liver failure. Fatty liver is associated with insulin resistance, increased body fat, high blood pressure (hypertension), and high cholesterol. These are features of metabolic syndrome and increase your risk for stroke, diabetes, and heart disease. What are the causes? This condition may be caused by:  Drinking too much alcohol.  Poor nutrition.  Obesity.  Cushing's syndrome.  Diabetes.  High cholesterol.  Certain drugs.  Poisons.  Some viral infections.  Pregnancy. What increases the risk? You are more likely to develop this condition if you:  Abuse alcohol.  Are overweight.  Have diabetes.  Have hepatitis.  Have a high triglyceride level.  Are pregnant. What are the signs or symptoms? Fatty liver disease often does not cause symptoms. If symptoms do develop, they can include:  Fatigue.  Weakness.  Weight loss.  Confusion.  Abdominal pain.  Nausea and vomiting.  Yellowing of your skin and the white parts of your eyes (jaundice).  Itchy skin. How is this diagnosed? This condition may be diagnosed by:  A physical  exam and medical history.  Blood tests.  Imaging tests, such as an ultrasound, CT scan, or MRI.  A liver biopsy. A small sample of liver tissue is removed using a needle. The sample is then looked at under a microscope. How is this treated? Fatty liver disease is often caused by other health conditions. Treatment for fatty liver may involve medicines and lifestyle changes to manage conditions such as:  Alcoholism.  High cholesterol.  Diabetes.  Being overweight or obese. Follow these instructions at home:   Do not drink alcohol. If you have trouble quitting, ask your health care provider how to safely quit with the help of medicine or a supervised program. This is important to keep your condition from getting worse.  Eat a healthy diet as told by your health care provider. Ask your health care provider about working with a diet and nutrition specialist (dietitian) to develop an eating plan.  Exercise regularly. This can help you lose weight and control your cholesterol and diabetes. Talk to your health care provider about an exercise plan and which activities are best for you.  Take over-the-counter and prescription medicines only as told by your health care provider.  Keep all follow-up visits as told by your health care provider. This is important. Contact a health care provider if: You have trouble controlling your:  Blood sugar. This is especially important if you have diabetes.  Cholesterol.  Drinking of alcohol. Get help right away if:  You have abdominal pain.  You have jaundice.  You have nausea and vomiting.  You vomit blood or material that looks like coffee grounds.  You have stools that are black, tar-like, or bloody. Summary  Fatty liver disease develops when too much fat builds up in the cells of your liver.  Fatty liver disease often causes no symptoms or problems. However, over time, fatty liver can cause inflammation that may lead to more serious  liver problems, such as scarring of the liver (cirrhosis).  You are more likely to develop this condition if you abuse alcohol, are pregnant, are overweight, have diabetes, have hepatitis, or have high triglyceride levels.  Contact your health care provider if you have trouble controlling your weight, blood sugar, cholesterol, or drinking of alcohol. This information is not intended to replace advice given to you by your health care provider. Make sure you discuss any questions you have with your health care provider. Document Revised: 06/28/2017 Document Reviewed: 04/24/2017 Elsevier Patient Education  2020 [employer]  Living With Sleep Apnea Sleep apnea is a condition in which breathing pauses or becomes shallow during sleep. Sleep apnea is most commonly caused by a collapsed or blocked airway. People with sleep apnea snore loudly and have times when they gasp and stop breathing for 10 seconds or more during sleep. This happens over and over during the night. This disrupts your sleep and keeps your body from getting the rest that it needs, which can cause tiredness and lack of energy (fatigue) during the day. The breaks in breathing also interrupt the deep sleep that you need to feel rested. Even if you do not completely wake up from the gaps in breathing, your sleep may not be restful. You may also have a headache in the morning and low energy during the day, and you may feel anxious or depressed. How can sleep apnea affect me? Sleep apnea increases your chances of extreme tiredness during the day (daytime fatigue). It can also increase your risk for health conditions, such as:  Heart attack.  Stroke.  Diabetes.  Heart failure.  Irregular heartbeat.  High blood pressure. If you have daytime fatigue as a result of sleep apnea, you may be more likely to:  Perform poorly at school or work.  Fall asleep while driving.  Have difficulty with attention.  Develop depression or  anxiety.  Become severely overweight (obese).  Have sexual dysfunction. What actions can I take to manage sleep apnea? Sleep apnea treatment   If you were given a device to open your airway while you sleep, use it only as told by your health care provider. You may be given: ? An oral appliance. This is a custom-made mouthpiece that shifts your lower jaw forward. ? A continuous positive airway pressure (CPAP) device. This device blows air through a mask when you breathe out (exhale). ? A nasal expiratory positive airway pressure (EPAP) device. This device has valves that you put into each nostril. ? A bi-level positive airway pressure (BPAP) device. This device blows air through a mask when you breathe in (inhale) and breathe out (exhale).  You may need surgery if other treatments do not work for you. Sleep habits  Go to sleep and wake up at the same time every day. This helps set your internal clock (circadian rhythm) for sleeping. ? If you stay up later than usual, such as on weekends, try to get up in the morning within 2 hours of your normal wake time.  Try to get at least 7-9 hours of sleep each night.  Stop computer, tablet,  and mobile phone use a few hours before bedtime.  Do not take long naps during the day. If you nap, limit it to 30 minutes.  Have a relaxing bedtime routine. Reading or listening to music may relax you and help you sleep.  Use your bedroom only for sleep. ? Keep your television and computer out of your bedroom. ? Keep your bedroom cool, dark, and quiet. ? Use a supportive mattress and pillows.  Follow your health care provider's instructions for other changes to sleep habits. Nutrition  Do not eat heavy meals in the evening.  Do not have caffeine in the later part of the day. The effects of caffeine can last for more than 5 hours.  Follow your health care provider's or dietitian's instructions for any diet changes. Lifestyle      Do not drink  alcohol before bedtime. Alcohol can cause you to fall asleep at first, but then it can cause you to wake up in the middle of the night and have trouble getting back to sleep.  Do not use any products that contain nicotine or tobacco, such as cigarettes and e-cigarettes. If you need help quitting, ask your health care provider. Medicines  Take over-the-counter and prescription medicines only as told by your health care provider.  Do not use over-the-counter sleep medicine. You can become dependent on this medicine, and it can make sleep apnea worse.  Do not use medicines, such as sedatives and narcotics, unless told by your health care provider. Activity  Exercise on most days, but avoid exercising in the evening. Exercising near bedtime can interfere with sleeping.  If possible, spend time outside every day. Natural light helps regulate your circadian rhythm. General information  Lose weight if you need to, and maintain a healthy weight.  Keep all follow-up visits as told by your health care provider. This is important.  If you are having surgery, make sure to tell your health care provider that you have sleep apnea. You may need to bring your device with you. Where to find more information Learn more about sleep apnea and daytime fatigue from:  American Sleep Association: sleepassociation.org  National Sleep Foundation: sleepfoundation.org  National Heart, Lung, and Blood Institute: [employer] Summary  Sleep apnea can cause daytime fatigue and other serious health conditions.  Both sleep apnea and daytime fatigue can be bad for your health and well-being.  You may need to wear a device while sleeping to help keep your airway open.  If you are having surgery, make sure to tell your health care provider that you have sleep apnea. You may need to bring your device with you.  Making changes to sleep habits, diet, lifestyle, and activity can help you manage sleep apnea. This  information is not intended to replace advice given to you by your health care provider. Make sure you discuss any questions you have with your health care provider. Document Revised: 11/07/2018 Document Reviewed: 10/10/2017 Elsevier Patient Education  2020 [employer].  Migraine Headache A migraine headache is an intense, throbbing pain on one side or both sides of the head. Migraine headaches may also cause other symptoms, such as nausea, vomiting, and sensitivity to light and noise. A migraine headache can last from 4 hours to 3 days. Talk with your doctor about what things may bring on (trigger) your migraine headaches. What are the causes? The exact cause of this condition is not known. However, a migraine may be caused when nerves in the brain become irritated  and release chemicals that cause inflammation of blood vessels. This inflammation causes pain. This condition may be triggered or caused by:  Drinking alcohol.  Smoking.  Taking medicines, such as: ? Medicine used to treat chest pain (nitroglycerin). ? Birth control pills. ? Estrogen. ? Certain blood pressure medicines.  Eating or drinking products that contain nitrates, glutamate, aspartame, or tyramine. Aged cheeses, chocolate, or caffeine may also be triggers.  Doing physical activity. Other things that may trigger a migraine headache include:  Menstruation.  Pregnancy.  Hunger.  Stress.  Lack of sleep or too much sleep.  Weather changes.  Fatigue. What increases the risk? The following factors may make you more likely to experience migraine headaches:  Being a certain age. This condition is more common in people who are 20-4 years old.  Being male.  Having a family history of migraine headaches.  Being Caucasian.  Having a mental health condition, such as depression or anxiety.  Being obese. What are the signs or symptoms? The main symptom of this condition is pulsating or throbbing pain. This  pain may:  Happen in any area of the head, such as on one side or both sides.  Interfere with daily activities.  Get worse with physical activity.  Get worse with exposure to bright lights or loud noises. Other symptoms may include:  Nausea.  Vomiting.  Dizziness.  General sensitivity to bright lights, loud noises, or smells. Before you get a migraine headache, you may get warning signs (an aura). An aura may include:  Seeing flashing lights or having blind spots.  Seeing bright spots, halos, or zigzag lines.  Having tunnel vision or blurred vision.  Having numbness or a tingling feeling.  Having trouble talking.  Having muscle weakness. Some people have symptoms after a migraine headache (postdromal phase), such as:  Feeling tired.  Difficulty concentrating. How is this diagnosed? A migraine headache can be diagnosed based on:  Your symptoms.  A physical exam.  Tests, such as: ? CT scan or an MRI of the head. These imaging tests can help rule out other causes of headaches. ? Taking fluid from the spine (lumbar puncture) and analyzing it (cerebrospinal fluid analysis, or CSF analysis). How is this treated? This condition may be treated with medicines that:  Relieve pain.  Relieve nausea.  Prevent migraine headaches. Treatment for this condition may also include:  Acupuncture.  Lifestyle changes like avoiding foods that trigger migraine headaches.  Biofeedback.  Cognitive behavioral therapy. Follow these instructions at home: Medicines  Take over-the-counter and prescription medicines only as told by your health care provider.  Ask your health care provider if the medicine prescribed to you: ? Requires you to avoid driving or using heavy machinery. ? Can cause constipation. You may need to take these actions to prevent or treat constipation:  Drink enough fluid to keep your urine pale yellow.  Take over-the-counter or prescription  medicines.  Eat foods that are high in fiber, such as beans, whole grains, and fresh fruits and vegetables.  Limit foods that are high in fat and processed sugars, such as fried or sweet foods. Lifestyle  Do not drink alcohol.  Do not use any products that contain nicotine or tobacco, such as cigarettes, e-cigarettes, and chewing tobacco. If you need help quitting, ask your health care provider.  Get at least 8 hours of sleep every night.  Find ways to manage stress, such as meditation, deep breathing, or yoga. General instructions      Keep  a journal to find out what may trigger your migraine headaches. For example, write down: ? What you eat and drink. ? How much sleep you get. ? Any change to your diet or medicines.  If you have a migraine headache: ? Avoid things that make your symptoms worse, such as bright lights. ? It may help to lie down in a dark, quiet room. ? Do not drive or use heavy machinery. ? Ask your health care provider what activities are safe for you while you are experiencing symptoms.  Keep all follow-up visits as told by your health care provider. This is important. Contact a health care provider if:  You develop symptoms that are different or more severe than your usual migraine headache symptoms.  You have more than 15 headache days in one month. Get help right away if:  Your migraine headache becomes severe.  Your migraine headache lasts longer than 72 hours.  You have a fever.  You have a stiff neck.  You have vision loss.  Your muscles feel weak or like you cannot control them.  You start to lose your balance often.  You have trouble walking.  You faint.  You have a seizure. Summary  A migraine headache is an intense, throbbing pain on one side or both sides of the head. Migraines may also cause other symptoms, such as nausea, vomiting, and sensitivity to light and noise.  This condition may be treated with medicines and  lifestyle changes. You may also need to avoid certain things that trigger a migraine headache.  Keep a journal to find out what may trigger your migraine headaches.  Contact your health care provider if you have more than 15 headache days in a month or you develop symptoms that are different or more severe than your usual migraine headache symptoms. This information is not intended to replace advice given to you by your health care provider. Make sure you discuss any questions you have with your health care provider. Document Revised: 11/07/2018 Document Reviewed: 08/28/2018 Elsevier Patient Education  2020 [employer].
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
## 2020-05-20 DIAGNOSIS — F1120 Opioid dependence, uncomplicated: Secondary | ICD-10-CM | POA: Diagnosis not present

## 2020-05-20 DIAGNOSIS — F3181 Bipolar II disorder: Secondary | ICD-10-CM | POA: Diagnosis not present

## 2020-05-24 DIAGNOSIS — Z5181 Encounter for therapeutic drug level monitoring: Secondary | ICD-10-CM | POA: Diagnosis not present

## 2020-05-24 DIAGNOSIS — Z79899 Other long term (current) drug therapy: Secondary | ICD-10-CM | POA: Diagnosis not present

## 2020-06-17 DIAGNOSIS — F3181 Bipolar II disorder: Secondary | ICD-10-CM | POA: Diagnosis not present

## 2020-06-17 DIAGNOSIS — F1120 Opioid dependence, uncomplicated: Secondary | ICD-10-CM | POA: Diagnosis not present

## 2020-06-17 DIAGNOSIS — Z79899 Other long term (current) drug therapy: Secondary | ICD-10-CM | POA: Diagnosis not present

## 2020-06-17 DIAGNOSIS — Z0389 Encounter for observation for other suspected diseases and conditions ruled out: Secondary | ICD-10-CM | POA: Diagnosis not present

## 2020-07-11 DIAGNOSIS — G4733 Obstructive sleep apnea (adult) (pediatric): Secondary | ICD-10-CM | POA: Diagnosis not present

## 2020-07-11 DIAGNOSIS — F3181 Bipolar II disorder: Secondary | ICD-10-CM | POA: Diagnosis not present

## 2020-07-11 DIAGNOSIS — F1120 Opioid dependence, uncomplicated: Secondary | ICD-10-CM | POA: Diagnosis not present

## 2020-07-12 DIAGNOSIS — Z79899 Other long term (current) drug therapy: Secondary | ICD-10-CM | POA: Diagnosis not present

## 2020-08-01 ENCOUNTER — Telehealth (INDEPENDENT_AMBULATORY_CARE_PROVIDER_SITE_OTHER): Payer: BC Managed Care – PPO | Admitting: Family Medicine

## 2020-08-01 ENCOUNTER — Other Ambulatory Visit: Payer: Self-pay

## 2020-08-01 ENCOUNTER — Other Ambulatory Visit: Payer: BC Managed Care – PPO

## 2020-08-01 ENCOUNTER — Encounter: Payer: Self-pay | Admitting: Family Medicine

## 2020-08-01 VITALS — Ht 71.0 in | Wt 280.0 lb

## 2020-08-01 DIAGNOSIS — R059 Cough, unspecified: Secondary | ICD-10-CM | POA: Insufficient documentation

## 2020-08-01 NOTE — Progress Notes (Signed)
States his mother is in the hospital with Covid.  Was running a 103 fever, congestion, light headed, and dizzy.   Has not been tested for Covid.

## 2020-08-01 NOTE — Assessment & Plan Note (Signed)
Concern for COVID-19 given symptoms and exposure.  We will try to get him tested.  Discussed symptomatic care with alternating Tylenol and ibuprofen over-the-counter.  Discussed seeking medical attention for shortness of breath, chest pain, and fevers persistently above 103 F without improvement with medication.  Discussed strict quarantine precautions for him and his family.  Advised to contact us with any concerns.
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
## 2020-08-03 ENCOUNTER — Encounter: Payer: Self-pay | Admitting: Family Medicine

## 2020-08-03 DIAGNOSIS — G4733 Obstructive sleep apnea (adult) (pediatric): Secondary | ICD-10-CM | POA: Diagnosis not present

## 2020-08-03 LAB — NOVEL CORONAVIRUS, NAA: SARS-CoV-2, NAA: DETECTED — AB

## 2020-08-03 LAB — SARS-COV-2, NAA 2 DAY TAT

## 2020-08-08 DIAGNOSIS — F1120 Opioid dependence, uncomplicated: Secondary | ICD-10-CM | POA: Diagnosis not present

## 2020-08-08 DIAGNOSIS — F3181 Bipolar II disorder: Secondary | ICD-10-CM | POA: Diagnosis not present

## 2020-08-16 DIAGNOSIS — Z79899 Other long term (current) drug therapy: Secondary | ICD-10-CM | POA: Diagnosis not present

## 2020-09-03 DIAGNOSIS — G4733 Obstructive sleep apnea (adult) (pediatric): Secondary | ICD-10-CM | POA: Diagnosis not present

## 2020-09-19 DIAGNOSIS — F3181 Bipolar II disorder: Secondary | ICD-10-CM | POA: Diagnosis not present

## 2020-09-19 DIAGNOSIS — F1120 Opioid dependence, uncomplicated: Secondary | ICD-10-CM | POA: Diagnosis not present

## 2020-09-20 DIAGNOSIS — Z79899 Other long term (current) drug therapy: Secondary | ICD-10-CM | POA: Diagnosis not present

## 2020-10-01 DIAGNOSIS — G4733 Obstructive sleep apnea (adult) (pediatric): Secondary | ICD-10-CM | POA: Diagnosis not present

## 2020-10-10 DIAGNOSIS — G4733 Obstructive sleep apnea (adult) (pediatric): Secondary | ICD-10-CM | POA: Diagnosis not present

## 2020-10-17 DIAGNOSIS — F3181 Bipolar II disorder: Secondary | ICD-10-CM | POA: Diagnosis not present

## 2020-10-17 DIAGNOSIS — F1120 Opioid dependence, uncomplicated: Secondary | ICD-10-CM | POA: Diagnosis not present

## 2020-10-19 DIAGNOSIS — Z79899 Other long term (current) drug therapy: Secondary | ICD-10-CM | POA: Diagnosis not present

## 2020-10-24 ENCOUNTER — Emergency Department: Payer: BC Managed Care – PPO

## 2020-10-24 ENCOUNTER — Emergency Department
Admission: EM | Admit: 2020-10-24 | Discharge: 2020-10-24 | Disposition: A | Discharge status: Home / Self Care | Payer: BC Managed Care – PPO | Attending: Emergency Medicine | Admitting: Emergency Medicine

## 2020-10-24 ENCOUNTER — Other Ambulatory Visit: Payer: Self-pay

## 2020-10-24 DIAGNOSIS — R1011 Right upper quadrant pain: Secondary | ICD-10-CM | POA: Diagnosis not present

## 2020-10-24 DIAGNOSIS — K2100 Gastro-esophageal reflux disease with esophagitis, without bleeding: Secondary | ICD-10-CM | POA: Diagnosis not present

## 2020-10-24 DIAGNOSIS — F17210 Nicotine dependence, cigarettes, uncomplicated: Secondary | ICD-10-CM | POA: Insufficient documentation

## 2020-10-24 DIAGNOSIS — K839 Disease of biliary tract, unspecified: Secondary | ICD-10-CM | POA: Diagnosis not present

## 2020-10-24 DIAGNOSIS — K828 Other specified diseases of gallbladder: Secondary | ICD-10-CM | POA: Diagnosis not present

## 2020-10-24 DIAGNOSIS — K838 Other specified diseases of biliary tract: Secondary | ICD-10-CM | POA: Diagnosis not present

## 2020-10-24 DIAGNOSIS — R1013 Epigastric pain: Secondary | ICD-10-CM | POA: Diagnosis not present

## 2020-10-24 LAB — COMPREHENSIVE METABOLIC PANEL
ALT: 51 U/L — ABNORMAL HIGH (ref 0–44)
AST: 36 U/L (ref 15–41)
Albumin: 4.3 g/dL (ref 3.5–5.0)
Alkaline Phosphatase: 50 U/L (ref 38–126)
Anion gap: 9 (ref 5–15)
BUN: 8 mg/dL (ref 6–20)
CO2: 24 mmol/L (ref 22–32)
Calcium: 9.4 mg/dL (ref 8.9–10.3)
Chloride: 105 mmol/L (ref 98–111)
Creatinine, Ser: 0.96 mg/dL (ref 0.61–1.24)
GFR, Estimated: >60 mL/min (ref >60)
Glucose, Bld: 105 mg/dL — ABNORMAL HIGH (ref 70–99)
Potassium: 3.8 mmol/L (ref 3.5–5.1)
Sodium: 138 mmol/L (ref 135–145)
Total Bilirubin: 1.1 mg/dL (ref 0.3–1.2)
Total Protein: 7.7 g/dL (ref 6.5–8.1)

## 2020-10-24 LAB — URINALYSIS, COMPLETE (UACMP) WITH MICROSCOPIC
Bilirubin Urine: NEGATIVE
Glucose, UA: NEGATIVE mg/dL
Ketones, ur: NEGATIVE mg/dL
Leukocytes,Ua: NEGATIVE
Nitrite: NEGATIVE
Protein, ur: NEGATIVE mg/dL
Specific Gravity, Urine: 1.021 (ref 1.005–1.030)
pH: 5 (ref 5.0–8.0)

## 2020-10-24 LAB — CBC
HCT: 44.8 % (ref 39.0–52.0)
Hemoglobin: 15.8 g/dL (ref 13.0–17.0)
MCH: 32.2 pg (ref 26.0–34.0)
MCHC: 35.3 g/dL (ref 30.0–36.0)
MCV: 91.4 fL (ref 80.0–100.0)
Platelets: 294 10*3/uL (ref 150–400)
RBC: 4.9 MIL/uL (ref 4.22–5.81)
RDW: 12.1 % (ref 11.5–15.5)
WBC: 7.1 10*3/uL (ref 4.0–10.5)
nRBC: 0 % (ref 0.0–0.2)

## 2020-10-24 LAB — LIPASE, BLOOD: Lipase: 28 U/L (ref 11–51)

## 2020-10-24 MED ORDER — LIDOCAINE VISCOUS HCL 2 % MT SOLN
15.0000 mL | Freq: Once | OROMUCOSAL | Status: AC
  Administered 2020-10-24: 15 mL via ORAL
  Filled 2020-10-24: qty 15

## 2020-10-24 MED ORDER — FAMOTIDINE 20 MG PO TABS: 20.0000 mg | ORAL_TABLET | Freq: Every day | ORAL | 1 refills | Status: DC

## 2020-10-24 MED ORDER — ONDANSETRON 4 MG PO TBDP: 4.0000 mg | ORAL_TABLET | Freq: Three times a day (TID) | ORAL | 0 refills | Status: DC | PRN

## 2020-10-24 MED ORDER — ALUM & MAG HYDROXIDE-SIMETH 200-200-20 MG/5ML PO SUSP
30.0000 mL | Freq: Once | ORAL | Status: AC
  Administered 2020-10-24: 30 mL via ORAL
  Filled 2020-10-24: qty 30

## 2020-10-24 NOTE — ED Triage Notes (Signed)
Pt c/o epigastric pain with N/V/D since last Wednesday, states the diarrhea stopped 2 days ago but is having the severe epigastric pain that seems to be worse at night when he lays down
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
## 2020-10-24 NOTE — Discharge Instructions (Signed)
As we discussed, your pain was likely due to acid reflux and possibly your gallbladder.  You are being discharged with 2 prescriptions: Pepcid medicine to take once daily every day to prevent acid buildup in your stomach and to help acid reflux. Zofran nausea medicine to use as needed every 6-8 hours for nausea and vomiting.  Please follow-up with one of our surgeons in the clinic in the next 1-2 weeks to discuss the diagnosis of gallbladder sludge and to discuss the possibility of having her gallbladder taken out electively in the future.  If you develop any severely worsening symptoms despite these medications, fevers with your pain or uncontrolled pain/vomiting, please return to the ED.  Use Tylenol for pain and fevers.  Up to 1000 mg per dose, up to 4 times per day.  Do not take more than 4000 mg of Tylenol/acetaminophen within 24 hours.[name]

## 2020-11-01 DIAGNOSIS — G4733 Obstructive sleep apnea (adult) (pediatric): Secondary | ICD-10-CM | POA: Diagnosis not present

## 2020-11-10 DIAGNOSIS — F3181 Bipolar II disorder: Secondary | ICD-10-CM | POA: Diagnosis not present

## 2020-11-10 DIAGNOSIS — F1120 Opioid dependence, uncomplicated: Secondary | ICD-10-CM | POA: Diagnosis not present

## 2020-11-14 ENCOUNTER — Ambulatory Visit: Payer: BC Managed Care – PPO | Admitting: Nurse Practitioner

## 2020-12-08 DIAGNOSIS — F3181 Bipolar II disorder: Secondary | ICD-10-CM | POA: Diagnosis not present

## 2020-12-08 DIAGNOSIS — F1120 Opioid dependence, uncomplicated: Secondary | ICD-10-CM | POA: Diagnosis not present

## 2020-12-24 DIAGNOSIS — G4733 Obstructive sleep apnea (adult) (pediatric): Secondary | ICD-10-CM | POA: Diagnosis not present

## 2021-01-10 DIAGNOSIS — F1120 Opioid dependence, uncomplicated: Secondary | ICD-10-CM | POA: Diagnosis not present

## 2021-01-10 DIAGNOSIS — F3181 Bipolar II disorder: Secondary | ICD-10-CM | POA: Diagnosis not present

## 2021-02-07 DIAGNOSIS — F1120 Opioid dependence, uncomplicated: Secondary | ICD-10-CM | POA: Diagnosis not present

## 2021-02-07 DIAGNOSIS — F3181 Bipolar II disorder: Secondary | ICD-10-CM | POA: Diagnosis not present

## 2021-03-07 DIAGNOSIS — F3181 Bipolar II disorder: Secondary | ICD-10-CM | POA: Diagnosis not present

## 2021-03-07 DIAGNOSIS — F1120 Opioid dependence, uncomplicated: Secondary | ICD-10-CM | POA: Diagnosis not present

## 2021-03-07 DIAGNOSIS — F17200 Nicotine dependence, unspecified, uncomplicated: Secondary | ICD-10-CM | POA: Diagnosis not present

## 2021-03-23 DIAGNOSIS — Z79899 Other long term (current) drug therapy: Secondary | ICD-10-CM | POA: Diagnosis not present

## 2021-03-28 DIAGNOSIS — F17200 Nicotine dependence, unspecified, uncomplicated: Secondary | ICD-10-CM | POA: Diagnosis not present

## 2021-03-28 DIAGNOSIS — F1120 Opioid dependence, uncomplicated: Secondary | ICD-10-CM | POA: Diagnosis not present

## 2021-03-28 DIAGNOSIS — F3181 Bipolar II disorder: Secondary | ICD-10-CM | POA: Diagnosis not present

## 2021-05-29 DIAGNOSIS — F3181 Bipolar II disorder: Secondary | ICD-10-CM | POA: Diagnosis not present

## 2021-05-29 DIAGNOSIS — F1120 Opioid dependence, uncomplicated: Secondary | ICD-10-CM | POA: Diagnosis not present

## 2021-05-29 DIAGNOSIS — F17200 Nicotine dependence, unspecified, uncomplicated: Secondary | ICD-10-CM | POA: Diagnosis not present

## 2021-06-23 DIAGNOSIS — G4733 Obstructive sleep apnea (adult) (pediatric): Secondary | ICD-10-CM | POA: Diagnosis not present

## 2021-07-23 DIAGNOSIS — G4733 Obstructive sleep apnea (adult) (pediatric): Secondary | ICD-10-CM | POA: Diagnosis not present

## 2021-07-27 DIAGNOSIS — F3181 Bipolar II disorder: Secondary | ICD-10-CM | POA: Diagnosis not present

## 2021-07-27 DIAGNOSIS — F1120 Opioid dependence, uncomplicated: Secondary | ICD-10-CM | POA: Diagnosis not present

## 2021-08-23 DIAGNOSIS — G4733 Obstructive sleep apnea (adult) (pediatric): Secondary | ICD-10-CM | POA: Diagnosis not present

## 2021-10-16 DIAGNOSIS — F1121 Opioid dependence, in remission: Secondary | ICD-10-CM | POA: Diagnosis not present

## 2021-10-16 DIAGNOSIS — F3132 Bipolar disorder, current episode depressed, moderate: Secondary | ICD-10-CM | POA: Diagnosis not present

## 2021-10-16 DIAGNOSIS — Z5181 Encounter for therapeutic drug level monitoring: Secondary | ICD-10-CM | POA: Diagnosis not present

## 2021-10-18 DIAGNOSIS — G4733 Obstructive sleep apnea (adult) (pediatric): Secondary | ICD-10-CM | POA: Diagnosis not present

## 2021-10-31 DIAGNOSIS — F1121 Opioid dependence, in remission: Secondary | ICD-10-CM | POA: Diagnosis not present

## 2021-10-31 DIAGNOSIS — F3132 Bipolar disorder, current episode depressed, moderate: Secondary | ICD-10-CM | POA: Diagnosis not present

## 2021-11-18 DIAGNOSIS — G4733 Obstructive sleep apnea (adult) (pediatric): Secondary | ICD-10-CM | POA: Diagnosis not present

## 2021-11-21 DIAGNOSIS — F1121 Opioid dependence, in remission: Secondary | ICD-10-CM | POA: Diagnosis not present

## 2021-11-21 DIAGNOSIS — F3132 Bipolar disorder, current episode depressed, moderate: Secondary | ICD-10-CM | POA: Diagnosis not present

## 2021-11-23 DIAGNOSIS — F1121 Opioid dependence, in remission: Secondary | ICD-10-CM | POA: Diagnosis not present

## 2021-11-23 DIAGNOSIS — F3132 Bipolar disorder, current episode depressed, moderate: Secondary | ICD-10-CM | POA: Diagnosis not present

## 2021-12-18 DIAGNOSIS — G4733 Obstructive sleep apnea (adult) (pediatric): Secondary | ICD-10-CM | POA: Diagnosis not present

## 2021-12-20 DIAGNOSIS — F1121 Opioid dependence, in remission: Secondary | ICD-10-CM | POA: Diagnosis not present

## 2021-12-20 DIAGNOSIS — F3132 Bipolar disorder, current episode depressed, moderate: Secondary | ICD-10-CM | POA: Diagnosis not present

## 2022-01-15 DIAGNOSIS — F1121 Opioid dependence, in remission: Secondary | ICD-10-CM | POA: Diagnosis not present

## 2022-01-15 DIAGNOSIS — F3132 Bipolar disorder, current episode depressed, moderate: Secondary | ICD-10-CM | POA: Diagnosis not present

## 2022-02-08 ENCOUNTER — Emergency Department: Payer: BC Managed Care – PPO

## 2022-02-08 ENCOUNTER — Encounter: Payer: Self-pay | Admitting: Emergency Medicine

## 2022-02-08 DIAGNOSIS — R079 Chest pain, unspecified: Secondary | ICD-10-CM | POA: Diagnosis not present

## 2022-02-08 DIAGNOSIS — R0789 Other chest pain: Secondary | ICD-10-CM | POA: Diagnosis not present

## 2022-02-08 DIAGNOSIS — K760 Fatty (change of) liver, not elsewhere classified: Secondary | ICD-10-CM | POA: Diagnosis not present

## 2022-02-08 LAB — CBC WITH DIFFERENTIAL/PLATELET
Abs Immature Granulocytes: 0.02 10*3/uL (ref 0.00–0.07)
Basophils Absolute: 0.1 10*3/uL (ref 0.0–0.1)
Basophils Relative: 1 %
Eosinophils Absolute: 0.4 10*3/uL (ref 0.0–0.5)
Eosinophils Relative: 5 %
HCT: 42.4 % (ref 39.0–52.0)
Hemoglobin: 14.6 g/dL (ref 13.0–17.0)
Immature Granulocytes: 0 %
Lymphocytes Relative: 34 %
Lymphs Abs: 2.9 10*3/uL (ref 0.7–4.0)
MCH: 32.6 pg (ref 26.0–34.0)
MCHC: 34.4 g/dL (ref 30.0–36.0)
MCV: 94.6 fL (ref 80.0–100.0)
Monocytes Absolute: 0.6 10*3/uL (ref 0.1–1.0)
Monocytes Relative: 7 %
Neutro Abs: 4.6 10*3/uL (ref 1.7–7.7)
Neutrophils Relative %: 53 %
Platelets: 292 10*3/uL (ref 150–400)
RBC: 4.48 MIL/uL (ref 4.22–5.81)
RDW: 11.9 % (ref 11.5–15.5)
WBC: 8.5 10*3/uL (ref 4.0–10.5)
nRBC: 0 % (ref 0.0–0.2)

## 2022-02-08 LAB — BASIC METABOLIC PANEL
Anion gap: 5 (ref 5–15)
BUN: 10 mg/dL (ref 6–20)
CO2: 28 mmol/L (ref 22–32)
Calcium: 8.8 mg/dL — ABNORMAL LOW (ref 8.9–10.3)
Chloride: 106 mmol/L (ref 98–111)
Creatinine, Ser: 1.2 mg/dL (ref 0.61–1.24)
GFR, Estimated: >60 mL/min (ref >60)
Glucose, Bld: 90 mg/dL (ref 70–99)
Potassium: 3.5 mmol/L (ref 3.5–5.1)
Sodium: 139 mmol/L (ref 135–145)

## 2022-02-08 LAB — TROPONIN I (HIGH SENSITIVITY): Troponin I (High Sensitivity): 6 ng/L (ref <18)

## 2022-02-08 NOTE — ED Triage Notes (Signed)
Pt presents via POV with complaints of left sided CP that started 3 days with associated "clammy" feeling. He notes taking motrin & tylenol for intermittent pain with mild relief. Denies fevers, nausea, or vomiting.

## 2022-02-08 NOTE — ED Provider Triage Note (Signed)
Emergency Medicine Provider Triage Evaluation Note  Austin Murray , a 31 y.o. male  was evaluated in triage.  Pt complains of chest pain that began 3 days ago. Left sided, described as squeezing. No SOB. No nausea. Cant identify anything that makes it better or worse. No personal or family history of PE/DVT. Daily smoker, 1ppd.  Patient Active Problem List   Diagnosis Date Noted   Cough 08/01/2020   NAFLD (nonalcoholic fatty liver disease) 63/78/5885   Vitamin D deficiency 05/16/2020   RLQ abdominal pain 02/10/2020   Slow transit constipation 02/10/2020   At risk for obstructive sleep apnea 01/19/2020   Smoker 01/19/2020   Opioid dependence in remission (HCC) 01/16/2020   Encounter for medical examination to establish care 12/15/2019   Elevated BP without diagnosis of hypertension 12/15/2019   Sleep apnea 12/15/2019   Migraine without aura and without status migrainosus, not intractable 12/15/2019   BMI 39.0-39.9,adult 12/15/2019   Pilonidal disease 12/15/2019   Bipolar depression (HCC) 12/15/2019   Feeling grief 12/15/2019   .  Review of Systems  Positive: CP Negative: SOB, nausea, diaphoresis  Physical Exam  There were no vitals taken for this visit. Gen:   Awake, no distress   Resp:  Normal effort  MSK:   Moves extremities without difficulty  Other:    Medical Decision Making  Medically screening exam initiated at 10:33 PM.  Appropriate orders placed.  Dionicia Abler Murray was informed that the remainder of the evaluation will be completed by another provider, this initial triage assessment does not replace that evaluation, and the importance of remaining in the ED until their evaluation is complete.     Jackelyn Hoehn, PA-C 02/08/22 2241

## 2022-02-09 ENCOUNTER — Emergency Department
Admission: EM | Admit: 2022-02-09 | Discharge: 2022-02-09 | Disposition: A | Discharge status: Home / Self Care | Payer: BC Managed Care – PPO | Attending: Emergency Medicine | Admitting: Emergency Medicine

## 2022-02-09 ENCOUNTER — Emergency Department: Payer: BC Managed Care – PPO

## 2022-02-09 DIAGNOSIS — R079 Chest pain, unspecified: Secondary | ICD-10-CM

## 2022-02-09 DIAGNOSIS — K760 Fatty (change of) liver, not elsewhere classified: Secondary | ICD-10-CM | POA: Diagnosis not present

## 2022-02-09 LAB — D-DIMER, QUANTITATIVE: D-Dimer, Quant: 0.72 ug/mL-FEU — ABNORMAL HIGH (ref 0.00–0.50)

## 2022-02-09 LAB — TROPONIN I (HIGH SENSITIVITY): Troponin I (High Sensitivity): 4 ng/L (ref <18)

## 2022-02-09 LAB — CK: Total CK: 936 U/L — ABNORMAL HIGH (ref 49–397)

## 2022-02-09 MED ORDER — NAPROXEN 500 MG PO TABS: 500.0000 mg | ORAL_TABLET | Freq: Two times a day (BID) | ORAL | 0 refills | Status: DC

## 2022-02-09 MED ORDER — SODIUM CHLORIDE 0.9 % IV BOLUS
1000.0000 mL | Freq: Once | INTRAVENOUS | Status: AC
Start: 2022-02-09 — End: 2022-02-09
  Administered 2022-02-09: 1000 mL via INTRAVENOUS

## 2022-02-09 MED ORDER — KETOROLAC TROMETHAMINE 30 MG/ML IJ SOLN
15.0000 mg | Freq: Once | INTRAMUSCULAR | Status: AC
Start: 2022-02-09 — End: 2022-02-09
  Administered 2022-02-09: 15 mg via INTRAVENOUS
  Filled 2022-02-09: qty 1

## 2022-02-09 MED ORDER — IOHEXOL 350 MG/ML SOLN
100.0000 mL | Freq: Once | INTRAVENOUS | Status: AC | PRN
Start: 2022-02-09 — End: 2022-02-09
  Administered 2022-02-09: 100 mL via INTRAVENOUS

## 2022-02-09 NOTE — ED Notes (Signed)
Patient transported to CT 
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
## 2022-02-09 NOTE — ED Notes (Signed)
Pt ambulated to and from bathroom with independent and steady gait

## 2022-02-09 NOTE — Discharge Instructions (Signed)
Take Naprosyn twice daily x7 days.  Apply moist heat to affected area several times daily.  Return to the ER for worsening symptoms, persistent vomiting, difficulty breathing or other concerns.

## 2022-02-15 DIAGNOSIS — F1121 Opioid dependence, in remission: Secondary | ICD-10-CM | POA: Diagnosis not present

## 2022-02-15 DIAGNOSIS — F3132 Bipolar disorder, current episode depressed, moderate: Secondary | ICD-10-CM | POA: Diagnosis not present

## 2022-04-08 NOTE — Progress Notes (Deleted)
No show

## 2022-04-09 ENCOUNTER — Encounter: Payer: Self-pay | Admitting: Cardiovascular Disease

## 2022-04-09 ENCOUNTER — Ambulatory Visit: Payer: BC Managed Care – PPO | Attending: Cardiovascular Disease | Admitting: Cardiovascular Disease

## 2022-04-09 DIAGNOSIS — R079 Chest pain, unspecified: Secondary | ICD-10-CM

## 2022-04-17 DIAGNOSIS — Z5181 Encounter for therapeutic drug level monitoring: Secondary | ICD-10-CM | POA: Diagnosis not present

## 2022-04-17 DIAGNOSIS — F1121 Opioid dependence, in remission: Secondary | ICD-10-CM | POA: Diagnosis not present

## 2022-04-17 DIAGNOSIS — F3132 Bipolar disorder, current episode depressed, moderate: Secondary | ICD-10-CM | POA: Diagnosis not present

## 2022-06-04 DIAGNOSIS — R079 Chest pain, unspecified: Secondary | ICD-10-CM | POA: Insufficient documentation
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
## 2022-06-05 ENCOUNTER — Ambulatory Visit: Payer: BC Managed Care – PPO | Attending: Cardiovascular Disease | Admitting: Cardiovascular Disease

## 2022-06-05 ENCOUNTER — Encounter: Payer: Self-pay | Admitting: Cardiovascular Disease

## 2022-06-05 VITALS — BP 140/80 | HR 90 | Ht 71.0 in | Wt 296.2 lb

## 2022-06-05 DIAGNOSIS — R079 Chest pain, unspecified: Secondary | ICD-10-CM

## 2022-06-05 DIAGNOSIS — F17200 Nicotine dependence, unspecified, uncomplicated: Secondary | ICD-10-CM | POA: Diagnosis not present

## 2022-06-05 NOTE — Patient Instructions (Signed)
Medication Instructions:  No changes  If you need a refill on your cardiac medications before your next appointment, please call your pharmacy.   Lab work: No new labs needed  Testing/Procedures: No new testing needed  Follow-Up: At [facility], you and your health needs are our priority.  As part of our continuing mission to provide you with exceptional heart care, we have created designated Provider Care Teams.  These Care Teams include your primary Cardiologist (physician) and Advanced Practice Providers (APPs -  Physician Assistants and Nurse Practitioners) who all work together to provide you with the care you need, when you need it.  You will need a follow up appointment as needed  Providers on your designated Care Team:   Christopher Berge, NP Ryan Dunn, PA-C Cadence Furth, PA-C  COVID-19 Vaccine Information can be found at: [email or web address] For questions related to vaccine distribution or appointments, please email [email or web address] or call [phone].    

## 2022-06-06 NOTE — Addendum Note (Signed)
Addended by: Ihor Dow on: 06/06/2022 10:56 AM   Modules accepted: Orders

## 2022-06-12 DIAGNOSIS — F3132 Bipolar disorder, current episode depressed, moderate: Secondary | ICD-10-CM | POA: Diagnosis not present

## 2022-06-12 DIAGNOSIS — F1121 Opioid dependence, in remission: Secondary | ICD-10-CM | POA: Diagnosis not present

## 2022-06-14 DIAGNOSIS — F1121 Opioid dependence, in remission: Secondary | ICD-10-CM | POA: Diagnosis not present

## 2022-06-27 DIAGNOSIS — F4323 Adjustment disorder with mixed anxiety and depressed mood: Secondary | ICD-10-CM | POA: Diagnosis not present

## 2022-07-06 DIAGNOSIS — F4323 Adjustment disorder with mixed anxiety and depressed mood: Secondary | ICD-10-CM | POA: Diagnosis not present

## 2022-07-10 DIAGNOSIS — F3132 Bipolar disorder, current episode depressed, moderate: Secondary | ICD-10-CM | POA: Diagnosis not present

## 2022-07-10 DIAGNOSIS — F1121 Opioid dependence, in remission: Secondary | ICD-10-CM | POA: Diagnosis not present

## 2022-07-13 DIAGNOSIS — F4323 Adjustment disorder with mixed anxiety and depressed mood: Secondary | ICD-10-CM | POA: Diagnosis not present

## 2022-07-20 DIAGNOSIS — F4323 Adjustment disorder with mixed anxiety and depressed mood: Secondary | ICD-10-CM | POA: Diagnosis not present

## 2022-07-27 DIAGNOSIS — F4323 Adjustment disorder with mixed anxiety and depressed mood: Secondary | ICD-10-CM | POA: Diagnosis not present

## 2022-07-27 DIAGNOSIS — F419 Anxiety disorder, unspecified: Secondary | ICD-10-CM | POA: Diagnosis not present

## 2022-07-27 DIAGNOSIS — F32A Depression, unspecified: Secondary | ICD-10-CM | POA: Diagnosis not present

## 2022-10-22 ENCOUNTER — Emergency Department
Admission: EM | Admit: 2022-10-22 | Discharge: 2022-10-22 | Disposition: A | Discharge status: Home / Self Care | Payer: BC Managed Care – PPO | Attending: Emergency Medicine | Admitting: Emergency Medicine

## 2022-10-22 DIAGNOSIS — K047 Periapical abscess without sinus: Secondary | ICD-10-CM

## 2022-10-22 DIAGNOSIS — K0889 Other specified disorders of teeth and supporting structures: Secondary | ICD-10-CM | POA: Diagnosis present

## 2022-10-22 MED ORDER — CLINDAMYCIN HCL 300 MG PO CAPS: 300.0000 mg | ORAL_CAPSULE | Freq: Four times a day (QID) | ORAL | 0 refills | Status: AC

## 2022-10-22 MED ORDER — CLINDAMYCIN HCL 300 MG PO CAPS: 300.0000 mg | ORAL_CAPSULE | Freq: Four times a day (QID) | ORAL | 0 refills | Status: DC

## 2022-10-22 NOTE — ED Triage Notes (Signed)
Pt sts that he has a dental abscess on the lower left jaw. Pt sts that he took some antibiotics at home that were left overs from something else and pt sts that helped but did not take it away completley.

## 2022-10-22 NOTE — ED Provider Notes (Signed)
[facility] Provider Note  Patient Contact: 6:09 PM (approximate)   History   Dental Pain   HPI  Austin Murray is a 32 y.o. male who presents the emergency department complaining of a left lower dental infection.  Patient has a broken tooth that needs to be extracted by his dentist.  Patient developed some pain and swelling in the area, had a days worth of clindamycin leftover from a previous prescription, took this and patient reported that most of his symptoms had improved.  He has had a slow return with worsening of the edema and pain.  No fevers or chills, difficulty breathing or swallowing.  No other complaints at this time.     Physical Exam   Triage Vital Signs: ED Triage Vitals [10/22/22 1721]  Enc Vitals Group     BP (!) 141/97     Pulse Rate 96     Resp 17     Temp 98.1 F (36.7 C)     Temp Source Oral     SpO2 97 %     Weight 270 lb (122.5 kg)     Height      Head Circumference      Peak Flow      Pain Score 7     Pain Loc      Pain Edu?      Excl. in [place]?     Most recent vital signs: Vitals:   10/22/22 1721  BP: (!) 141/97  Pulse: 96  Resp: 17  Temp: 98.1 F (36.7 C)  SpO2: 97%     General: Alert and in no acute distress. ENT:      Ears:       Nose: No congestion/rhinnorhea.      Mouth/Throat: Mucous membranes are moist.  Erythema and edema along the left gumline of the left mandible.  No appreciable drainable abscess.  No submandibular erythema or edema. Neck: No stridor. No erythema or edema of the anterior neck  Cardiovascular:  Good peripheral perfusion Respiratory: Normal respiratory effort without tachypnea or retractions. Lungs CTAB. Musculoskeletal: Full range of motion to all extremities.  Neurologic:  No gross focal neurologic deficits are appreciated.  Skin:   No rash noted Other:   ED Results / Procedures / Treatments   Labs (all labs ordered are listed, but only abnormal results are displayed) Labs  Reviewed - No data to display   EKG     RADIOLOGY    No results found.  PROCEDURES:  Critical Care performed: No  Procedures   MEDICATIONS ORDERED IN ED: Medications - No data to display   IMPRESSION / MDM / [place] / ED COURSE  I reviewed the triage vital signs and the nursing notes.                                 Differential diagnosis includes, but is not limited to, dental infection, dental abscess, facial abscess, Ludwig's angina  Patient's presentation is most consistent with acute presentation with potential threat to life or bodily function.   Patient's diagnosis is consistent with dental infection. Patient presents the emergency department complaining of left lower dental pain and swelling.  Patient started to have symptoms, took a days worth of clindamycin that almost fully alleviated the symptoms.  However he started to have a return.  Findings on physical exam were consistent with dental infection.  No  evidence of appreciable drainable abscess.  At this time based off of exam do not feel there is any need for investigation with labs or imaging.  Patient will be placed on antibiotics.  Follow-up with his dentist.  Return precautions discussed with the patient. Patient is given ED precautions to return to the ED for any worsening or new symptoms.     FINAL CLINICAL IMPRESSION(S) / ED DIAGNOSES   Final diagnoses:  Dental infection     Rx / DC Orders   ED Discharge Orders          Ordered    clindamycin (CLEOCIN) 300 MG capsule  4 times daily        10/22/22 1814             Note:  This document was prepared using Dragon voice recognition software and may include unintentional dictation errors.   Brynda Peon 10/22/22 1814    Lavonia Drafts, MD 10/22/22 [phone]

## 2022-11-08 IMAGING — US US ABDOMEN LIMITED RUQ/ASCITES
1 series · 14 of 25 positions shown · non-contrast
Comparison: CT 02/10/2020

CLINICAL DATA: Right upper quadrant and chest pain

EXAM:
ULTRASOUND ABDOMEN LIMITED RIGHT UPPER QUADRANT

[Series 1: us abdomen limited ruq (liver/gb) · 14 of 84 slices shown]
[im 1/84]
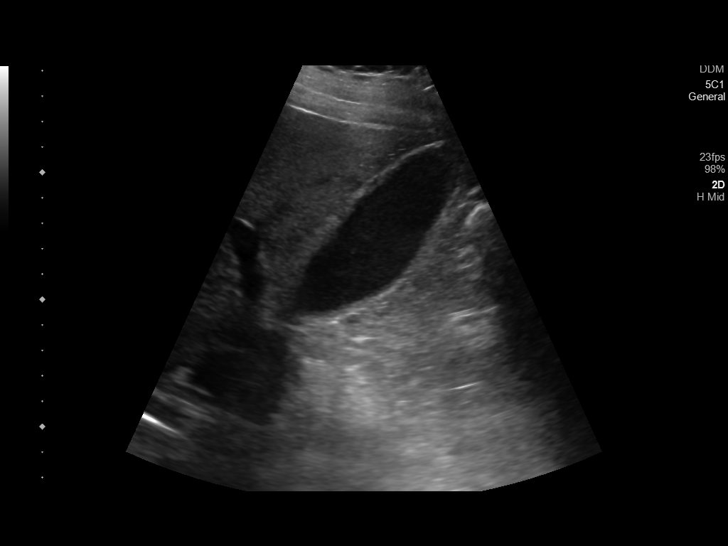
[im 7/84]
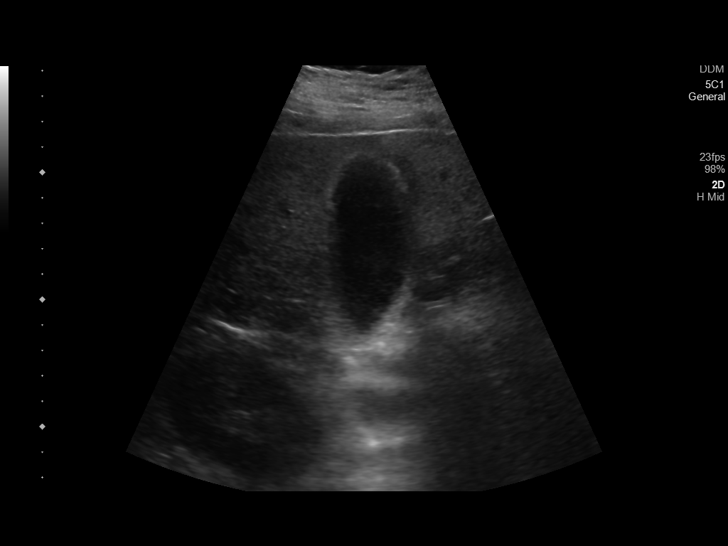
[im 14/84]
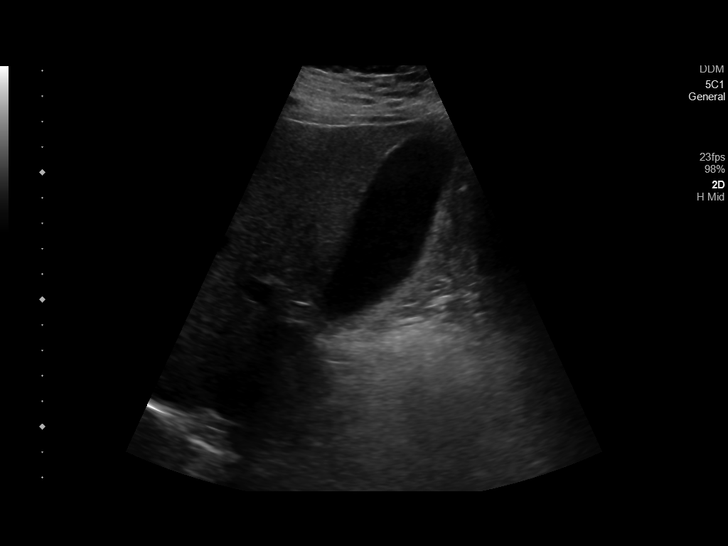
[im 21/84]
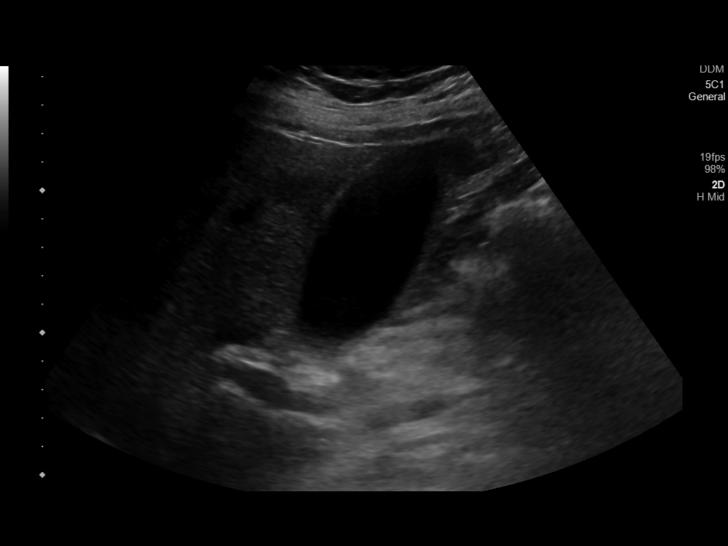
[im 28/84]
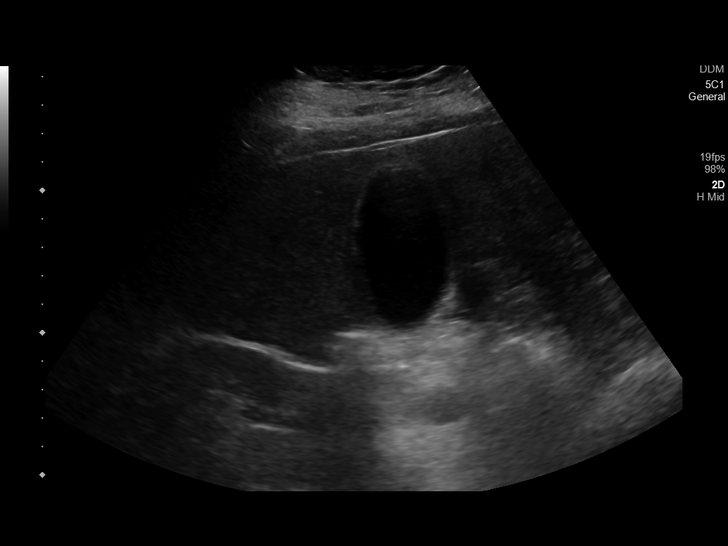
[im 32/84]
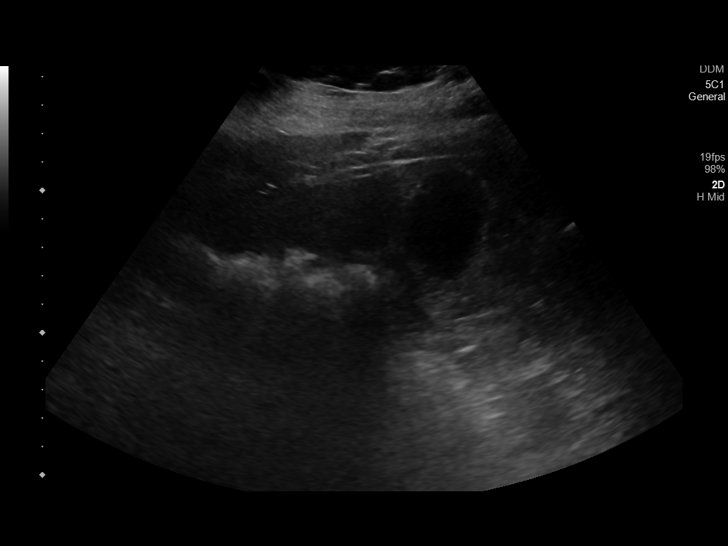
[im 39/84]
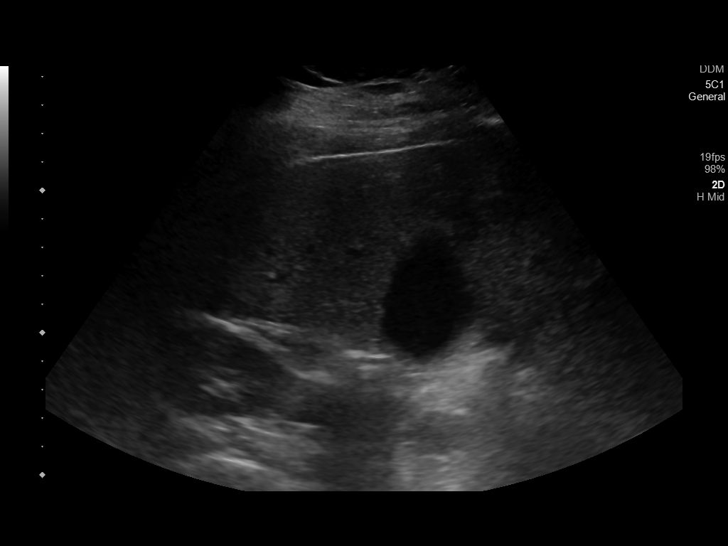
[im 45/84]
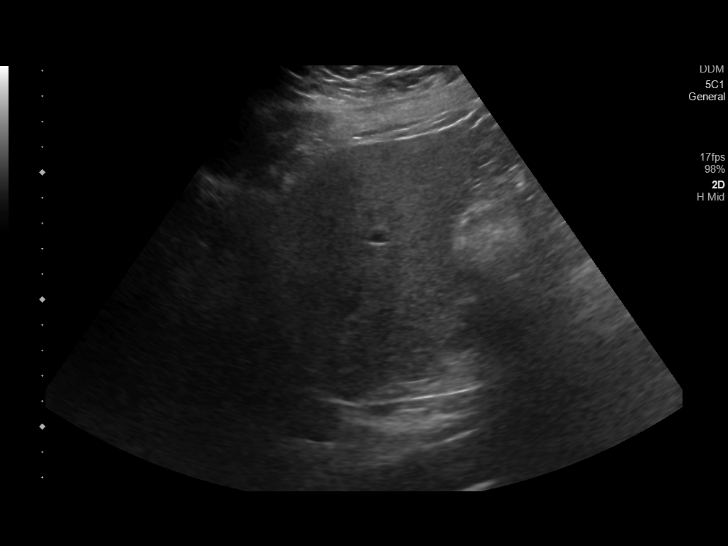
[im 52/84]
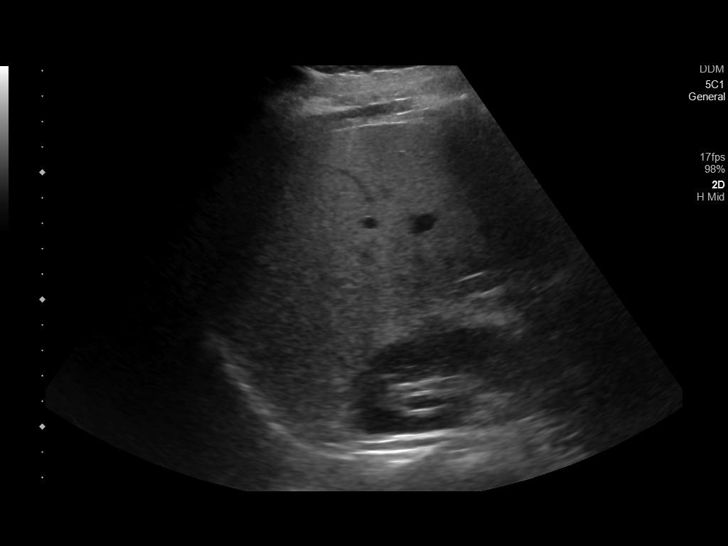
[im 56/84]
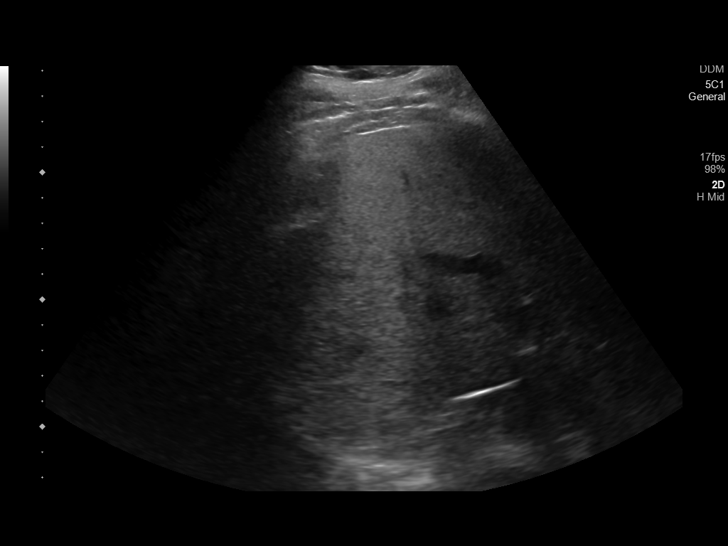
[im 63/84]
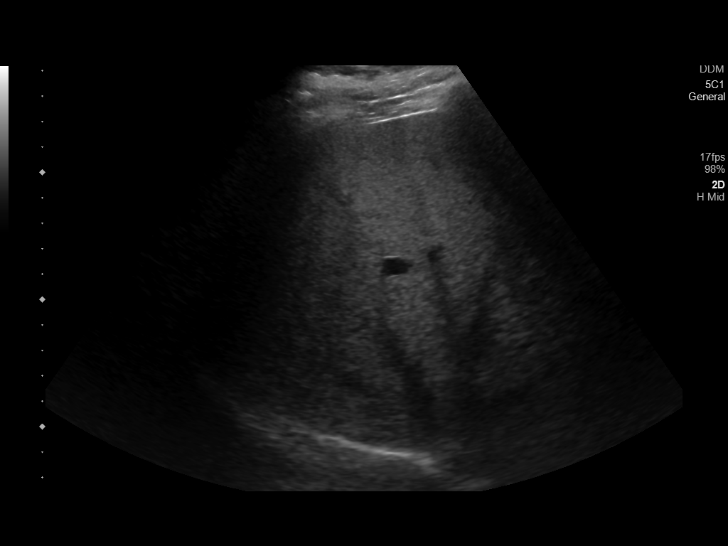
[im 70/84]
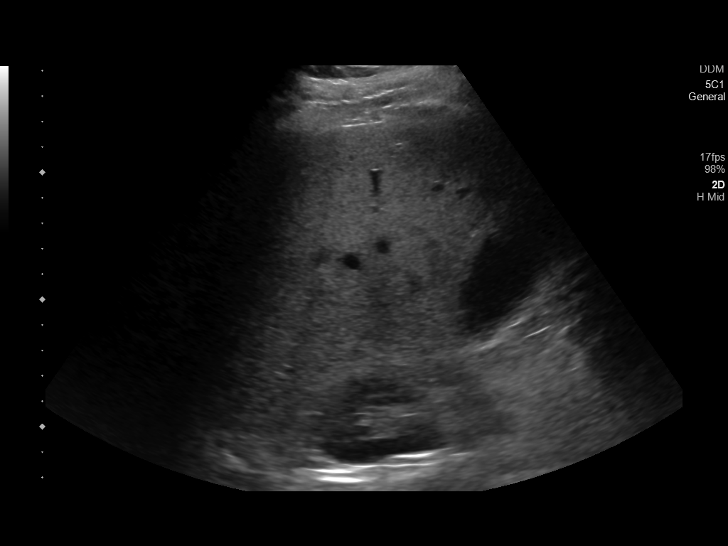
[im 77/84]
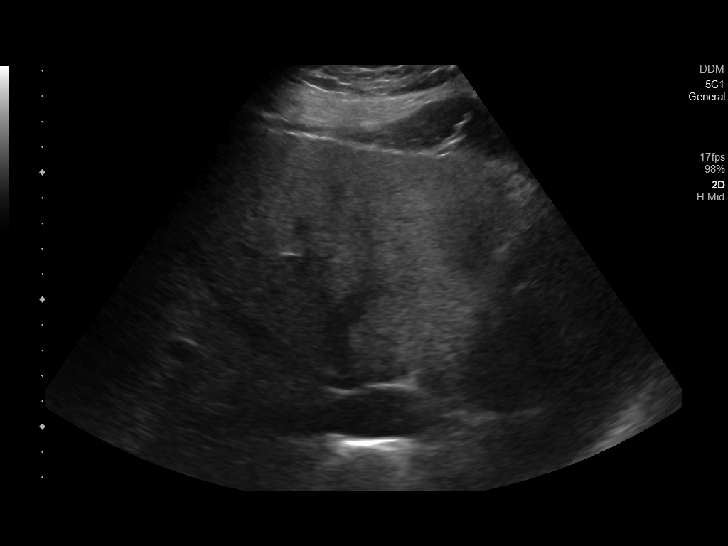
[im 84/84]
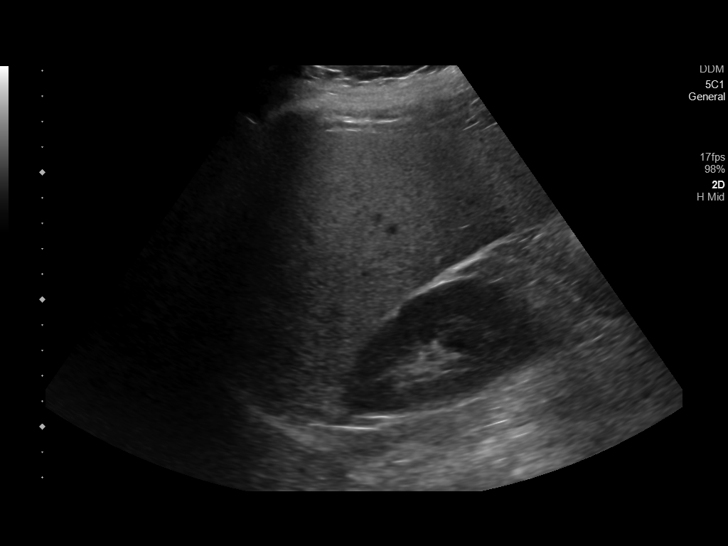

[14 of 25 positions shown; findings below may reference images not displayed]

FINDINGS: Gallbladder:

No gallstones or wall thickening visualized. Small amount of
gallbladder sludge. No sonographic Murphy sign noted by sonographer.

Common bile duct:

Diameter: 3.2 mm

Liver:

Echogenic liver without focal hepatic abnormality. Portal vein is
patent on color Doppler imaging with normal direction of blood flow
towards the liver.

Other: None.
IMPRESSION: 1. Minimal gallbladder sludge without shadowing stone.
2. Echogenic liver consistent with steatosis

## 2024-02-08 ENCOUNTER — Ambulatory Visit: Admission: EM | Admit: 2024-02-08 | Discharge: 2024-02-08 | Disposition: A | Discharge status: Home / Self Care

## 2024-02-08 DIAGNOSIS — K029 Dental caries, unspecified: Secondary | ICD-10-CM

## 2024-02-08 DIAGNOSIS — K047 Periapical abscess without sinus: Secondary | ICD-10-CM | POA: Diagnosis not present

## 2024-02-08 MED ORDER — CLINDAMYCIN HCL 300 MG PO CAPS
300.0000 mg | ORAL_CAPSULE | Freq: Four times a day (QID) | ORAL | 0 refills | Status: AC
Start: 2024-02-08 — End: 2024-02-15

## 2024-02-08 MED ORDER — IBUPROFEN 800 MG PO TABS: 800.0000 mg | ORAL_TABLET | Freq: Three times a day (TID) | ORAL | 0 refills | Status: AC | PRN

## 2024-02-08 NOTE — ED Triage Notes (Signed)
 Pt being seen in UC for dental pain and facial swelling to L side of jaw x1 day. Pt reports having dentist appt scheduled for Monday. Pt denies n/v and fevers. Pt reports taking motrin  and tylenol  and benzocaine.
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
## 2024-02-08 NOTE — Discharge Instructions (Addendum)
 Take the clindamycin  and ibuprofen  as directed.  Follow-up with your dentist on Monday.  Go to the emergency department if you have worsening symptoms.

## 2024-08-07 DIAGNOSIS — R399 Unspecified symptoms and signs involving the genitourinary system: Secondary | ICD-10-CM | POA: Insufficient documentation
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
## 2024-08-07 NOTE — Assessment & Plan Note (Signed)
 Today we reviewed the physiology and common causes of male lower urinary tract symptoms (LUTS). Discussed potential etiologies including infectious, inflammatory, bladder-related, benign prostatic hyperplasia (BPH), and musculoskeletal/pelvic floor contributions. Reviewed the standard diagnostic workup (urinalysis, PVR, uroflow, prostate assessment, possible cystoscopy or imaging) and the spectrum of initial management strategies ranging from behavioral and lifestyle measures to pharmacologic therapy, with procedural options if indicated. All questions were addressed and the patient expressed understanding of the evaluation and treatment pathway.

## 2024-08-12 ENCOUNTER — Encounter: Payer: Self-pay | Admitting: Urology

## 2024-08-12 ENCOUNTER — Ambulatory Visit: Admitting: Urology

## 2024-08-12 VITALS — BP 137/79 | HR 102 | Ht 71.0 in | Wt 319.2 lb

## 2024-08-12 DIAGNOSIS — R399 Unspecified symptoms and signs involving the genitourinary system: Secondary | ICD-10-CM

## 2024-08-13 LAB — BLADDER SCAN AMB NON-IMAGING

## 2024-08-13 NOTE — Addendum Note (Signed)
 Addended by: Batoul Limes E on: 08/13/2024 02:19 PM   Modules accepted: Orders

## 2024-12-08 ENCOUNTER — Ambulatory Visit: Admitting: Physician Assistant

## 2024-12-11 ENCOUNTER — Ambulatory Visit: Admitting: Physician Assistant
